# Patient Record
Sex: Female | Born: 1945 | Race: White | Hispanic: No | Marital: Married | Smoking: Former smoker
Health system: Southern US, Community
[De-identification: ages and names within clinical notes are randomized; demographics above are authoritative.]

## PROBLEM LIST (undated history)

## (undated) DIAGNOSIS — R413 Other amnesia: Secondary | ICD-10-CM

## (undated) DIAGNOSIS — R768 Other specified abnormal immunological findings in serum: Secondary | ICD-10-CM

## (undated) DIAGNOSIS — I2510 Atherosclerotic heart disease of native coronary artery without angina pectoris: Secondary | ICD-10-CM

## (undated) DIAGNOSIS — E782 Mixed hyperlipidemia: Secondary | ICD-10-CM

## (undated) DIAGNOSIS — I10 Essential (primary) hypertension: Secondary | ICD-10-CM

## (undated) DIAGNOSIS — J329 Chronic sinusitis, unspecified: Secondary | ICD-10-CM

## (undated) HISTORY — DX: Atherosclerotic heart disease of native coronary artery without angina pectoris: I25.10

## (undated) HISTORY — DX: Essential (primary) hypertension: I10

## (undated) HISTORY — DX: Other specified abnormal immunological findings in serum: R76.8

## (undated) HISTORY — DX: Mixed hyperlipidemia: E78.2

## (undated) HISTORY — PX: NASAL SINUS SURGERY: SHX719

## (undated) HISTORY — DX: Chronic sinusitis, unspecified: J32.9

---
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
## 1997-02-01 HISTORY — PX: LAPAROSCOPIC CHOLECYSTECTOMY: SUR755

## 1999-02-02 HISTORY — PX: BREAST BIOPSY: SHX20

## 2000-02-02 HISTORY — PX: TOTAL ABDOMINAL HYSTERECTOMY W/ BILATERAL SALPINGOOPHORECTOMY: SHX83

## 2010-03-25 ENCOUNTER — Encounter: Payer: Self-pay | Admitting: Cardiology

## 2010-04-22 ENCOUNTER — Encounter: Payer: Self-pay | Admitting: *Deleted

## 2010-04-22 DIAGNOSIS — E7800 Pure hypercholesterolemia, unspecified: Secondary | ICD-10-CM

## 2010-04-22 DIAGNOSIS — E782 Mixed hyperlipidemia: Secondary | ICD-10-CM | POA: Insufficient documentation

## 2010-04-22 DIAGNOSIS — R079 Chest pain, unspecified: Secondary | ICD-10-CM

## 2010-04-22 DIAGNOSIS — I10 Essential (primary) hypertension: Secondary | ICD-10-CM | POA: Insufficient documentation

## 2010-04-30 ENCOUNTER — Encounter: Payer: Self-pay | Admitting: Cardiology

## 2010-05-01 ENCOUNTER — Encounter: Payer: Self-pay | Admitting: Cardiology

## 2010-05-01 ENCOUNTER — Ambulatory Visit (INDEPENDENT_AMBULATORY_CARE_PROVIDER_SITE_OTHER): Payer: BC Managed Care – PPO | Admitting: Cardiology

## 2010-05-01 ENCOUNTER — Encounter: Payer: Self-pay | Admitting: *Deleted

## 2010-05-01 ENCOUNTER — Other Ambulatory Visit: Payer: Self-pay | Admitting: Cardiology

## 2010-05-01 VITALS — BP 158/81 | HR 61 | Ht 64.0 in | Wt 167.0 lb

## 2010-05-01 DIAGNOSIS — R079 Chest pain, unspecified: Secondary | ICD-10-CM

## 2010-05-01 DIAGNOSIS — Z79899 Other long term (current) drug therapy: Secondary | ICD-10-CM

## 2010-05-01 DIAGNOSIS — I200 Unstable angina: Secondary | ICD-10-CM

## 2010-05-01 DIAGNOSIS — E782 Mixed hyperlipidemia: Secondary | ICD-10-CM

## 2010-05-01 DIAGNOSIS — R0602 Shortness of breath: Secondary | ICD-10-CM

## 2010-05-01 DIAGNOSIS — I10 Essential (primary) hypertension: Secondary | ICD-10-CM

## 2010-05-01 NOTE — Assessment & Plan Note (Signed)
Reportedly long term, on Crestor. This has been followed by Dr. Margo Common over time.

## 2010-05-01 NOTE — Patient Instructions (Signed)
Your physician has requested that you have a cardiac catheterization. Cardiac catheterization is used to diagnose and/or treat various heart conditions. Doctors may recommend this procedure for a number of different reasons. The most common reason is to evaluate chest pain. Chest pain can be a symptom of coronary artery disease (CAD), and cardiac catheterization can show whether plaque is narrowing or blocking your heart's arteries. This procedure is also used to evaluate the valves, as well as measure the blood flow and oxygen levels in different parts of your heart. For further information please visit [email or web address] Please follow instruction sheet, as given. Your physician recommends that you go to the [facility] for lab work and a chest x-ray. PLEASE DO TODAY, IF POSSIBLE.

## 2010-05-01 NOTE — Assessment & Plan Note (Signed)
Blood pressure elevated today. Toprol-XL recently added by Dr. Margo Common. She is on labetalol as well, and likely this can be simplified going forward. An ACE inhibitor can be considered after angiography is completed.

## 2010-05-01 NOTE — Progress Notes (Signed)
Clinical Summary Kirsten Martinez is a 65 y.o.female referred for cardiology consultation by Dr. Margo Common. She is here with her husband. She reports a three-month history of sporadic "sharp" central chest discomfort, typically associated with "tingling" in her left arm, and also an ache on the left side of her jaw. This can occur with exercise and also at rest, usually moderate in intensity, and typically resolves after 5 minutes. Symptoms have been increasing in general. She states that she has not been as active, not walking regularly since injuring her right foot late last year. Prior to that she was walking 2 miles at a time without symptoms. She states she has not undergone any prior cardiac testing.  History is reviewed including long-standing hyperlipidemia, reported TIA approximately 3 years ago, and also family history of premature cardiovascular disease. She had a brother that died in his 82s suddenly with an MI.  Faxed copy of ECG from 20 to February shows sinus rhythm, nonspecific T wave changes.   Allergies  Allergen Reactions  . Azithromycin Other (See Comments)    Usually doesn't help  . Codeine Nausea Only    Current outpatient prescriptions:aspirin 81 MG tablet, Take 81 mg by mouth daily.  , Disp: , Rfl: ;  Calcium Carbonate-Vitamin D (CALTRATE 600+D) 600-400 MG-UNIT per tablet, Take 1 tablet by mouth daily.  , Disp: , Rfl: ;  cetirizine (ZYRTEC) 10 MG tablet, Take 10 mg by mouth daily.  , Disp: , Rfl: ;  labetalol (NORMODYNE) 100 MG tablet, Take 100 mg by mouth 2 (two) times daily.  , Disp: , Rfl:  metoprolol succinate (TOPROL-XL) 25 MG 24 hr tablet, Take 25 mg by mouth daily.  , Disp: , Rfl: ;  Multiple Vitamin (MULTIVITAMIN) tablet, Take 1 tablet by mouth daily.  , Disp: , Rfl: ;  nitroGLYCERIN (NITROSTAT) 0.4 MG SL tablet, Place 0.4 mg under the tongue every 5 (five) minutes as needed.  , Disp: , Rfl: ;  omeprazole (PRILOSEC OTC) 20 MG tablet, Take 20 mg by mouth daily.  , Disp: ,  Rfl:  rosuvastatin (CRESTOR) 10 MG tablet, Take 10 mg by mouth daily.  , Disp: , Rfl: ;  traZODone (DESYREL) 50 MG tablet, Take 50 mg by mouth at bedtime.  , Disp: , Rfl:    Past Medical History  Diagnosis Date  . Mixed hyperlipidemia   . Recurrent sinusitis     Suspected underlying allergic rhinitis  . Essential hypertension, benign   . Helicobacter pylori ab+     Negative EGD    Past Surgical History  Procedure Date  . Laparoscopic cholecystectomy 1999  . Breast biopsy 2001    Left  . Total abdominal hysterectomy w/ bilateral salpingoophorectomy 2002  . Nasal sinus surgery     Family History  Problem Relation Age of Onset  . Heart attack Brother     Died at age 39    Social History Ms. Romm reports that she quit smoking about 37 years ago. Her smoking use included Cigarettes. She quit after 6 years of use. She has never used smokeless tobacco. Ms. Steptoe reports that she does not drink alcohol.  Review of Systems No fevers, chills. Has gained weight since retiring. No progressive shortness of breath, cough, hemoptysis, or wheezing. No palpitations, dizziness, or syncope. No dysphasia or odynophagia. Stable appetite with no abdominal pain, melena, or hematochezia. No orthopnea, PND, or lower extremity edema. No focal motor weakness, memory problems, or speech deficits. Otherwise systems reviewed and negative except  as already outlined.   Physical Examination Filed Vitals:   05/01/10 0838  BP: 158/81  Pulse: 61  Overweight woman in no acute distress. HEENT: Conjunctiva and lids normal, oropharynx clear. Neck: Supple, no elevated JVP or carotid bruits, no thyromegaly. Lungs: Clear to auscultation, nonlabored. Cardiac: Regular rate and rhythm, no S3 or pericardial rub. Abdomen: Soft, nontender, bowel sounds present. Extremities: No pitting edema, distal pulses full. Musculoskeletal: No kyphosis. Skin: Warm and dry. Neuropsychiatric: Alert and oriented x3,  affect appropriate.   Problem List and Plan

## 2010-05-01 NOTE — Assessment & Plan Note (Signed)
Symptoms suggestive of angina, with typical and atypical features. Progressive over the last 3 months. Patient has not been as physically active during this time as well. Recent ECG reviewed and overall nonspecific. In light of her risk factor profile including family history of premature cardiovascular disease, hypertension, long-standing hyperlipidemia, and reported TIA in the past, we discussed proceeding on to further ischemic evaluation. After reviewing the risks and benefits of both noninvasive and invasive techniques, plan is to proceed with a diagnostic cardiac catheterization. This is being scheduled for next week.

## 2010-05-07 ENCOUNTER — Inpatient Hospital Stay (HOSPITAL_BASED_OUTPATIENT_CLINIC_OR_DEPARTMENT_OTHER)
Admission: RE | Admit: 2010-05-07 | Discharge: 2010-05-07 | Disposition: A | Discharge status: Home / Self Care | Payer: BC Managed Care – PPO | Source: Ambulatory Visit | Attending: Cardiovascular Disease | Admitting: Cardiovascular Disease

## 2010-05-07 DIAGNOSIS — I2510 Atherosclerotic heart disease of native coronary artery without angina pectoris: Secondary | ICD-10-CM | POA: Insufficient documentation

## 2010-05-26 ENCOUNTER — Ambulatory Visit (INDEPENDENT_AMBULATORY_CARE_PROVIDER_SITE_OTHER): Payer: BC Managed Care – PPO | Admitting: Cardiology

## 2010-05-26 ENCOUNTER — Encounter: Payer: Self-pay | Admitting: Cardiology

## 2010-05-26 VITALS — BP 128/75 | HR 68 | Ht 63.0 in | Wt 166.1 lb

## 2010-05-26 DIAGNOSIS — I2510 Atherosclerotic heart disease of native coronary artery without angina pectoris: Secondary | ICD-10-CM

## 2010-05-26 DIAGNOSIS — E782 Mixed hyperlipidemia: Secondary | ICD-10-CM

## 2010-05-26 DIAGNOSIS — I10 Essential (primary) hypertension: Secondary | ICD-10-CM

## 2010-05-26 MED ORDER — LABETALOL HCL 200 MG PO TABS: 200.0000 mg | ORAL_TABLET | Freq: Two times a day (BID) | ORAL | Status: DC

## 2010-05-26 NOTE — Assessment & Plan Note (Signed)
Nonobstructive in the major epicardials with 80% ostial diagonal stenosis, to be managed medically at this point. Continue present regimen with modifications as noted, diet and exercise discussed.

## 2010-05-26 NOTE — Assessment & Plan Note (Signed)
Plan to simplify her medications, since she is on 2 beta blockers. Will stop Toprol-XL, and increase labetalol to 200 mg p.o. B.i.d. She plans to keep a more regular check on her blood pressure an outpatient as well.

## 2010-05-26 NOTE — Assessment & Plan Note (Signed)
Continue Crestor, followup with Dr. Margo Common for fasting lipid profile and liver function tests over the next 3 months.

## 2010-05-26 NOTE — Progress Notes (Signed)
Clinical Summary Kirsten Martinez is a 65 y.o.female presenting for followup. She was seen in consultation in late March, referred for cardiac catheterization with chest pain symptoms in the setting of multiple cardiac risk factors. Procedure was performed by Dr. Excell Seltzer, outlined below. She was noted to have ostial diagonal disease that was felt to be best managed medically, at least initially, with PCI options limited to PTCA if needed.  She is here with her husband for followup, doing well overall. We reviewed the results of her cardiac catheterization, also anticipated medical therapy at this time. We discussed diet and exercise.   Allergies  Allergen Reactions  . Azithromycin Other (See Comments)    Usually doesn't help  . Codeine Nausea Only    Current outpatient prescriptions:aspirin 81 MG tablet, Take 81 mg by mouth daily.  , Disp: , Rfl: ;  Calcium Carbonate-Vitamin D (CALTRATE 600+D) 600-400 MG-UNIT per tablet, Take 1 tablet by mouth daily.  , Disp: , Rfl: ;  cetirizine (ZYRTEC) 10 MG tablet, Take 10 mg by mouth daily.  , Disp: , Rfl: ;  Multiple Vitamin (MULTIVITAMIN) tablet, Take 1 tablet by mouth daily.  , Disp: , Rfl:  omeprazole (PRILOSEC OTC) 20 MG tablet, Take 20 mg by mouth daily.  , Disp: , Rfl: ;  rosuvastatin (CRESTOR) 10 MG tablet, Take 10 mg by mouth daily.  , Disp: , Rfl: ;  traZODone (DESYREL) 50 MG tablet, Take 50 mg by mouth at bedtime.  , Disp: , Rfl: ;  DISCONTD: labetalol (NORMODYNE) 100 MG tablet, Take 100 mg by mouth 2 (two) times daily.  , Disp: , Rfl:  DISCONTD: metoprolol succinate (TOPROL-XL) 25 MG 24 hr tablet, Take 25 mg by mouth daily.  , Disp: , Rfl: ;  labetalol (NORMODYNE) 200 MG tablet, Take 1 tablet (200 mg total) by mouth 2 (two) times daily., Disp: 90 tablet, Rfl: 3;  nitroGLYCERIN (NITROSTAT) 0.4 MG SL tablet, Place 0.4 mg under the tongue every 5 (five) minutes as needed.  , Disp: , Rfl:   Past Medical History  Diagnosis Date  . Mixed hyperlipidemia     . Recurrent sinusitis     Suspected underlying allergic rhinitis  . Essential hypertension, benign   . Helicobacter pylori ab+     Negative EGD  . Coronary atherosclerosis of native coronary artery     Nonobstructive except 80% diagonal    Social History Kirsten Martinez reports that she quit smoking about 37 years ago. Her smoking use included Cigarettes. She quit after 6 years of use. She has never used smokeless tobacco. Kirsten Martinez reports that she does not drink alcohol.  Review of Systems Otherwise reviewed and negative except as outlined.  Physical Examination Filed Vitals:   05/26/10 1023  BP: 128/75  Pulse: 68  Overweight woman in no acute distress. HEENT: Conjunctiva and lids normal, oropharynx clear. Neck: Supple, no elevated JVP or carotid bruits, no thyromegaly. Lungs: Clear to auscultation, nonlabored. Cardiac: Regular rate and rhythm, no S3 or pericardial rub. Abdomen: Soft, nontender, bowel sounds present. Extremities: No pitting edema, distal pulses full. Musculoskeletal: No kyphosis. Skin: Warm and dry. Neuropsychiatric: Alert and oriented x3, affect appropriate.  Studies Cardiac catheterization 05/07/2010: Coronary angiography:  The left main is angiographically normal and   divides into the LAD and left circumflex.      LAD:  The LAD is of large caliber.  It is patent all the way to the left   ventricular apex.  There is no significant obstructive  disease.  There   are some minor luminal irregularities in the mid-LAD.  The first   diagonal is small.  It has no significant obstructive disease.  The   second diagonal is of moderate caliber.  It has an 80% ostial stenosis.   The ostial stenosis does not change with intracoronary nitroglycerin.   The remaining portions of the vessel are smooth without significant   obstructive disease.      Left circumflex:  The left circumflex is patent.  There is no   obstructive disease.  It supplies two small  obtuse marginal round   branches without significant disease.      Right coronary artery:  The right coronary artery is very large.  It is   a dominant vessel with a high anterior origin.  I had to use an AL-1   catheter to reach the vessel for angiography.  The vessel has mild 20-   30% ostial stenosis.  Nonobstructive mid vessel stenosis of 25% and   otherwise has no significant stenosis.  The vessel gives off a PDA and 2   posterolateral branches as well as an acute marginal branch.  There is   no significant stenosis in the branch vessels of the RCA.  Problem List and Plan

## 2010-05-26 NOTE — Patient Instructions (Signed)
   Follow up in 3 months as scheduled.  Stop Toprol XL  Increase Labetalol to 200mg  two times a day.

## 2010-06-04 NOTE — Cardiovascular Report (Signed)
Kirsten Martinez, Kirsten Martinez           ACCOUNT NO.:  [id number]  MEDICAL RECORD NO.:  [id number]           PATIENT TYPE:  LOCATION:                                 FACILITY:  PHYSICIAN:  Veverly Fells. Excell Seltzer, MD  DATE OF BIRTH:  [birth year]  DATE OF PROCEDURE:  05/07/2010 DATE OF DISCHARGE:                           CARDIAC CATHETERIZATION   PROCEDURES: 1. Left heart catheterization. 2. Selective coronary angiography. 3. Left ventricular angiography.  SURGEON:  Veverly Fells. Excell Seltzer, MD  Hilarie Fredrickson INDICATIONS:  [name] is a 65 year old woman with chest pain.  She has angina and has both typical and atypical features. She has multiple underlying cardiac risk factors and was referred directly for cardiac cath because of high pretest probability.  Risks and indications of the procedure were reviewed with the patient. Informed consent was obtained.  The right groin was prepped, draped, and anesthetized with 1% lidocaine.  Using the modified Seldinger technique, a 4-French sheath was placed in the right femoral artery.  Standard Judkins catheters were used for coronary angiography and left ventriculography.  The patient tolerated the procedure well.  There were no immediate complications.  PROCEDURAL FINDINGS:  Aortic pressure 122/63 with a mean of 88, left ventricular pressure 127/13.  Left ventriculography:  LV function is normal.  The ejection fraction is estimated at 65%.  There is no mitral regurgitation.  Coronary angiography:  The left main is angiographically normal and divides into the LAD and left circumflex.  LAD:  The LAD is of large caliber.  It is patent all the way to the left ventricular apex.  There is no significant obstructive disease.  There are some minor luminal irregularities in the mid-LAD.  The first diagonal is small.  It has no significant obstructive disease.  The second diagonal is of moderate caliber.  It has an 80% ostial stenosis. The ostial  stenosis does not change with intracoronary nitroglycerin. The remaining portions of the vessel are smooth without significant obstructive disease.  Left circumflex:  The left circumflex is patent.  There is no obstructive disease.  It supplies two small obtuse marginal round branches without significant disease.  Right coronary artery:  The right coronary artery is very large.  It is a dominant vessel with a high anterior origin.  I had to use an AL-1 catheter to reach the vessel for angiography.  The vessel has mild 20- 30% ostial stenosis.  Nonobstructive mid vessel stenosis of 25% and otherwise has no significant stenosis.  The vessel gives off a PDA and 2 posterolateral branches as well as an acute marginal branch.  There is no significant stenosis in the branch vessels of the RCA.  FINAL ASSESSMENT: 1. Severe ostial diagonal stenosis. 2. 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 3. Normal left ventricular systolic function.  RECOMMENDATIONS:  With single branch vessel disease and preserved LV function, I would favor initial medical therapy.  The ostial nature of her diagonal stenosis would limit our PCI options to balloon angioplasty alone.  Could consider cutting balloon angioplasty if she has refractory chest pain.     Casimiro Needle  Karren Burly, MD     MDC/MEDQ  D:  05/07/2010  T:  05/08/2010  Job:  [id number]  cc:   Jonelle Sidle, MD  Electronically Signed by Tonny Bollman MD on 06/04/2010 10:36:23 AM

## 2010-08-25 ENCOUNTER — Ambulatory Visit: Payer: BC Managed Care – PPO | Admitting: Cardiology

## 2011-06-27 ENCOUNTER — Other Ambulatory Visit: Payer: Self-pay | Admitting: Cardiology

## 2011-09-04 ENCOUNTER — Other Ambulatory Visit: Payer: Self-pay | Admitting: Cardiology

## 2011-12-03 ENCOUNTER — Other Ambulatory Visit: Payer: Self-pay | Admitting: Cardiology

## 2012-01-04 ENCOUNTER — Other Ambulatory Visit: Payer: Self-pay | Admitting: Cardiology

## 2012-02-11 ENCOUNTER — Ambulatory Visit (INDEPENDENT_AMBULATORY_CARE_PROVIDER_SITE_OTHER): Payer: Medicare PPO | Admitting: Cardiology

## 2012-02-11 ENCOUNTER — Encounter: Payer: Self-pay | Admitting: Cardiology

## 2012-02-11 VITALS — BP 122/69 | HR 64 | Ht 63.5 in | Wt 160.8 lb

## 2012-02-11 DIAGNOSIS — I10 Essential (primary) hypertension: Secondary | ICD-10-CM

## 2012-02-11 DIAGNOSIS — I2510 Atherosclerotic heart disease of native coronary artery without angina pectoris: Secondary | ICD-10-CM

## 2012-02-11 DIAGNOSIS — E782 Mixed hyperlipidemia: Secondary | ICD-10-CM

## 2012-02-11 NOTE — Patient Instructions (Addendum)

## 2012-02-11 NOTE — Progress Notes (Signed)
Clinical Summary Ms. Buechner is a 67 y.o.female presenting for followup. She was last seen in April 2012. She is here with her husband today. States in general she has done well, no definite angina symptoms, although she does have an intermittent tingling in her left forearm area with activity. Episode occurred a year ago with increased psychosocial stressors as well. She has not required any increasing nitroglycerin use, otherwise compliant with medical therapy.  She reports good lipid control on Crestor, followed by Dr. Margo Common. ECG today is normal.  We reviewed her prior cardiac history.   Allergies  Allergen Reactions  . Azithromycin Other (See Comments)    Usually doesn't help  . Codeine Nausea Only    Current Outpatient Prescriptions  Medication Sig Dispense Refill  . aspirin 81 MG tablet Take 81 mg by mouth daily.        . Calcium Carbonate-Vitamin D (CALTRATE 600+D) 600-400 MG-UNIT per tablet Take 1 tablet by mouth daily.        Marland Kitchen labetalol (NORMODYNE) 200 MG tablet TAKE 1 TABLET TWICE A DAY  60 tablet  1  . Multiple Vitamin (MULTIVITAMIN) tablet Take 1 tablet by mouth daily.        . nitroGLYCERIN (NITROSTAT) 0.4 MG SL tablet Place 0.4 mg under the tongue every 5 (five) minutes as needed.        Marland Kitchen omeprazole (PRILOSEC OTC) 20 MG tablet Take 20 mg by mouth daily.        . rosuvastatin (CRESTOR) 10 MG tablet Take 10 mg by mouth daily.        . traZODone (DESYREL) 50 MG tablet Take 50 mg by mouth at bedtime.          Past Medical History  Diagnosis Date  . Mixed hyperlipidemia   . Recurrent sinusitis     Suspected underlying allergic rhinitis  . Essential hypertension, benign   . Helicobacter pylori ab+     Negative EGD  . Coronary atherosclerosis of native coronary artery     Nonobstructive except 80% diagonal    Social History Ms. Meller reports that she quit smoking about 39 years ago. Her smoking use included Cigarettes. She quit after 6 years of use. She  has never used smokeless tobacco. Ms. Ambrosius reports that she does not drink alcohol.  Review of Systems  No palpitations or syncope. No unusual bleeding problems. Stable appetite. Otherwise negative   Physical Examination Filed Vitals:   02/11/12 1311  BP: 122/69  Pulse: 64   Filed Weights   02/11/12 1311  Weight: 160 lb 12.8 oz (72.938 kg)    No acute distress.  HEENT: Conjunctiva and lids normal, oropharynx clear.  Neck: Supple, no elevated JVP or carotid bruits, no thyromegaly.  Lungs: Clear to auscultation, nonlabored.  Cardiac: Regular rate and rhythm, no S3 or pericardial rub.  Abdomen: Soft, nontender, bowel sounds present.  Extremities: No pitting edema, distal pulses full.  Musculoskeletal: No kyphosis.  Skin: Warm and dry.  Neuropsychiatric: Alert and oriented x3, affect appropriate.   Problem List and Plan   Coronary atherosclerosis of native coronary artery Branch vessel disease as described above. Reports no advancing, clear-cut angina symptoms and ECG today is normal. Continue plan for observation and medical therapy. Annual followup arranged.  Essential hypertension, benign Blood pressure control looks good today.  Mixed hyperlipidemia Followed by Dr. Margo Common, on statin therapy. Goal LDL should be close to 70 if possible.    Jonelle Sidle, M.D., F.A.C.C.

## 2012-02-11 NOTE — Assessment & Plan Note (Signed)
Branch vessel disease as described above. Reports no advancing, clear-cut angina symptoms and ECG today is normal. Continue plan for observation and medical therapy. Annual followup arranged.

## 2012-02-11 NOTE — Assessment & Plan Note (Signed)
Blood pressure control looks good today. 

## 2012-02-11 NOTE — Assessment & Plan Note (Signed)
Followed by Dr. Margo Common, on statin therapy. Goal LDL should be close to 70 if possible.

## 2013-05-10 ENCOUNTER — Encounter: Payer: Self-pay | Admitting: Cardiology

## 2013-05-10 ENCOUNTER — Ambulatory Visit (INDEPENDENT_AMBULATORY_CARE_PROVIDER_SITE_OTHER): Payer: Medicare PPO | Admitting: Cardiology

## 2013-05-10 VITALS — BP 137/69 | HR 68 | Ht 63.5 in | Wt 157.8 lb

## 2013-05-10 DIAGNOSIS — I2510 Atherosclerotic heart disease of native coronary artery without angina pectoris: Secondary | ICD-10-CM

## 2013-05-10 DIAGNOSIS — E782 Mixed hyperlipidemia: Secondary | ICD-10-CM

## 2013-05-10 DIAGNOSIS — I10 Essential (primary) hypertension: Secondary | ICD-10-CM

## 2013-05-10 MED ORDER — NITROGLYCERIN 0.4 MG SL SUBL: 0.4000 mg | SUBLINGUAL_TABLET | SUBLINGUAL | Status: AC | PRN

## 2013-05-10 NOTE — Progress Notes (Signed)
    Clinical Summary Kirsten Martinez is a 68 y.o.female last seen in January 2014. She is here with her husband. Reports doing well without angina symptoms, has had no use of nitroglycerin. She has just got back to walking with her husband for exercise. Reports occasionally a sharp mid posterior thoracic discomfort is nonexertional. Otherwise no other symptoms. ECG today shows sinus rhythm with low voltage.  Lab work from October 2014 showed TSH 2.3, cholesterol 160, HDL 59, LDL 84, BUN 13, creatinine 0.6, potassium 4.1, normal LFTs.  She reports compliance with her medications.  Allergies  Allergen Reactions  . Azithromycin Other (See Comments)    Usually doesn't help  . Codeine Nausea Only    Current Outpatient Prescriptions  Medication Sig Dispense Refill  . aspirin 81 MG tablet Take 81 mg by mouth daily.        . Calcium Carbonate-Vitamin D (CALTRATE 600+D) 600-400 MG-UNIT per tablet Take 1 tablet by mouth daily.        Marland Kitchen. labetalol (NORMODYNE) 200 MG tablet TAKE 1 TABLET TWICE A DAY  60 tablet  1  . Multiple Vitamin (MULTIVITAMIN) tablet Take 1 tablet by mouth daily.        . nitroGLYCERIN (NITROSTAT) 0.4 MG SL tablet Place 1 tablet (0.4 mg total) under the tongue every 5 (five) minutes as needed.  25 tablet  3  . omeprazole (PRILOSEC OTC) 20 MG tablet Take 20 mg by mouth as needed.       . rosuvastatin (CRESTOR) 10 MG tablet Take 10 mg by mouth daily.        . traZODone (DESYREL) 50 MG tablet Take 50 mg by mouth at bedtime as needed.        No current facility-administered medications for this visit.    Past Medical History  Diagnosis Date  . Mixed hyperlipidemia   . Recurrent sinusitis     Suspected underlying allergic rhinitis  . Essential hypertension, benign   . Helicobacter pylori ab+     Negative EGD  . Coronary atherosclerosis of native coronary artery     Nonobstructive except 80% diagonal    Social History Ms. Toste reports that she quit smoking about 40  years ago. Her smoking use included Cigarettes. She smoked 0.00 packs per day for 6 years. She has never used smokeless tobacco. Kirsten Martinez reports that she does not drink alcohol.  Review of Systems No palpitations, dizziness, orthopnea, PND, syncope.  Physical Examination Filed Vitals:   05/10/13 0952  BP: 137/69  Pulse: 68   Filed Weights   05/10/13 0952  Weight: 157 lb 12.8 oz (71.578 kg)    No acute distress.  HEENT: Conjunctiva and lids normal, oropharynx clear.  Neck: Supple, no elevated JVP or carotid bruits, no thyromegaly.  Lungs: Clear to auscultation, nonlabored.  Cardiac: Regular rate and rhythm, no S3 or pericardial rub.  Abdomen: Soft, nontender, bowel sounds present.  Extremities: No pitting edema, distal pulses full.  Musculoskeletal: No kyphosis.  Skin: Warm and dry.  Neuropsychiatric: Alert and oriented x3, affect appropriate.   Problem List and Plan   Coronary atherosclerosis of native coronary artery Largely nonobstructive with branch vessel disease in the diagonal system, symptomatically stable on medical therapy. Continue present regimen, encouraged regular exercise. Continue annual followup, sooner if symptoms develop.  Essential hypertension, benign No change to present regimen.  Mixed hyperlipidemia LDL 84 as of October 2014, continues on Crestor.    Jonelle SidleSamuel G. Pallie Swigert, M.D., F.A.C.C.

## 2013-05-10 NOTE — Assessment & Plan Note (Signed)
Largely nonobstructive with branch vessel disease in the diagonal system, symptomatically stable on medical therapy. Continue present regimen, encouraged regular exercise. Continue annual followup, sooner if symptoms develop.

## 2013-05-10 NOTE — Assessment & Plan Note (Signed)
LDL 84 as of October 2014, continues on Crestor.

## 2013-05-10 NOTE — Patient Instructions (Signed)

## 2013-05-10 NOTE — Assessment & Plan Note (Signed)
No change to present regimen.

## 2013-12-17 NOTE — Progress Notes (Signed)
HPI: [name] is a 68 y/o patient of Dr. Diona Martinez we are seeing for ongoing assessment and management of non-obstructive CAD, Hypertension. She was last seen by Dr. Diona Martinez in April of 2015 and is here for 6 month follow up. No changes were made on last office visit.     She comes today with three to four months of increasing fatigue, mild DOE while climbing stairs, and one episode of chest pain while first awakening in the morning, pressure,radiating to her jaw, mild nausea. She took one NTG and pain subsided. She has also been having mild dizziness. Thinking it was middle ear, she went to see PCP who examined her ears and found them to be normal.   She had cardiac cath in 2012 which demonstrated severe ostial diagonal stenosis, RCA large dominent with 20-30% stenosis, mid vessel 25%. Left Cx was without disease.    She has not had any further chest or jaw pain since episode, but has been feeling tired.    Allergies  Allergen Reactions  . Azithromycin Other (See Comments)    Usually doesn't help  . Codeine Nausea Only    Current Outpatient Prescriptions  Medication Sig Dispense Refill  . aspirin 81 MG tablet Take 81 mg by mouth daily.      . Calcium Carbonate-Vitamin D (CALTRATE 600+D) 600-400 MG-UNIT per tablet Take 1 tablet by mouth daily.      Marland Kitchen. labetalol (NORMODYNE) 200 MG tablet TAKE 1 TABLET TWICE A DAY 60 tablet 1  . Multiple Vitamin (MULTIVITAMIN) tablet Take 1 tablet by mouth daily.      . nitroGLYCERIN (NITROSTAT) 0.4 MG SL tablet Place 1 tablet (0.4 mg total) under the tongue every 5 (five) minutes as needed. 25 tablet 3  . omeprazole (PRILOSEC OTC) 20 MG tablet Take 20 mg by mouth as needed.     . rosuvastatin (CRESTOR) 10 MG tablet Take 10 mg by mouth daily.      . traZODone (DESYREL) 50 MG tablet Take 50 mg by mouth at bedtime as needed.      No current facility-administered medications for this visit.    Past Medical History  Diagnosis Date  . Mixed  hyperlipidemia   . Recurrent sinusitis     Suspected underlying allergic rhinitis  . Essential hypertension, benign   . Helicobacter pylori ab+     Negative EGD  . Coronary atherosclerosis of native coronary artery     Nonobstructive except 80% diagonal    Past Surgical History  Procedure Laterality Date  . Laparoscopic cholecystectomy  1999  . Breast biopsy  2001    Left  . Total abdominal hysterectomy w/ bilateral salpingoophorectomy  2002  . Nasal sinus surgery      ROS: Review of systems complete and found to be negative unless listed above  PHYSICAL EXAM BP 138/84 mmHg  Pulse 69  Ht 5\' 3"  (1.6 m)  Wt 146 lb (66.225 kg)  BMI 25.87 kg/m2 General: Well developed, well nourished, in no acute distress Head: Eyes PERRLA, No xanthomas.   Normal cephalic and atramatic  Lungs: Clear bilaterally to auscultation and percussion. Heart: HRRR S1 S2, without MRG.  Pulses are 2+ & equal.            No carotid bruit. No JVD.  No abdominal bruits. No femoral bruits. Abdomen: Bowel sounds are positive, abdomen soft and non-tender without masses or  Hernia's noted. Msk:  Back normal, normal gait. Normal strength and tone for age. Extremities: No clubbing, cyanosis or edema.  DP +1 Neuro: Alert and oriented X 3. Psych:  Good affect, responds appropriately   EKG: NSR rate of 64 bpm.  ASSESSMENT AND PLAN

## 2013-12-18 ENCOUNTER — Ambulatory Visit (INDEPENDENT_AMBULATORY_CARE_PROVIDER_SITE_OTHER): Payer: Medicare PPO | Admitting: Adult Health

## 2013-12-18 ENCOUNTER — Encounter: Payer: Self-pay | Admitting: *Deleted

## 2013-12-18 ENCOUNTER — Encounter: Payer: Self-pay | Admitting: Adult Health

## 2013-12-18 VITALS — BP 138/84 | HR 69 | Ht 63.0 in | Wt 146.0 lb

## 2013-12-18 DIAGNOSIS — I25110 Atherosclerotic heart disease of native coronary artery with unstable angina pectoris: Secondary | ICD-10-CM

## 2013-12-18 DIAGNOSIS — I10 Essential (primary) hypertension: Secondary | ICD-10-CM

## 2013-12-18 DIAGNOSIS — R079 Chest pain, unspecified: Secondary | ICD-10-CM

## 2013-12-18 MED ORDER — ISOSORBIDE MONONITRATE ER 30 MG PO TB24
ORAL_TABLET | ORAL | Status: DC
Start: 2013-12-18 — End: 2017-03-08

## 2013-12-18 NOTE — Progress Notes (Deleted)
Name: Kirsten CommanderLinda B Rayburn    DOB: 1945-05-04  Age: 68 y.o.  MR#: [id number]       PCP:  Louie BostonAPPER,DAVID B, MD      Insurance: Payor: [employer] MEDICARE / Plan: [employer] MEDICARE CHOICE PPO / Product Type: *No Product type* /   CC:    Chief Complaint  Patient presents with  . Coronary Artery Disease  . Hypertension    VS Filed Vitals:   12/18/13 1511  BP: 138/84  Pulse: 69  Height: 5\' 3"  (1.6 m)  Weight: 146 lb (66.225 kg)    Weights Current Weight  12/18/13 146 lb (66.225 kg)  05/10/13 157 lb 12.8 oz (71.578 kg)  02/11/12 160 lb 12.8 oz (72.938 kg)    Blood Pressure  BP Readings from Last 3 Encounters:  12/18/13 138/84  05/10/13 137/69  02/11/12 122/69     Admit date:  (Not on file) Last encounter with RMR:  Visit date not found   Allergy Azithromycin and Codeine  Current Outpatient Prescriptions  Medication Sig Dispense Refill  . aspirin 81 MG tablet Take 81 mg by mouth daily.      . Calcium Carbonate-Vitamin D (CALTRATE 600+D) 600-400 MG-UNIT per tablet Take 1 tablet by mouth daily.      Marland Kitchen. labetalol (NORMODYNE) 200 MG tablet TAKE 1 TABLET TWICE A DAY 60 tablet 1  . Multiple Vitamin (MULTIVITAMIN) tablet Take 1 tablet by mouth daily.      . nitroGLYCERIN (NITROSTAT) 0.4 MG SL tablet Place 1 tablet (0.4 mg total) under the tongue every 5 (five) minutes as needed. 25 tablet 3  . omeprazole (PRILOSEC OTC) 20 MG tablet Take 20 mg by mouth as needed.     . rosuvastatin (CRESTOR) 10 MG tablet Take 10 mg by mouth daily.      . traZODone (DESYREL) 50 MG tablet Take 50 mg by mouth at bedtime as needed.      No current facility-administered medications for this visit.    Discontinued Meds:   There are no discontinued medications.  Patient Active Problem List   Diagnosis Date Noted  . Coronary atherosclerosis of native coronary artery 05/26/2010  . Essential hypertension, benign 04/22/2010  . Mixed hyperlipidemia 04/22/2010    LABS No results found for: NA, K, CL, CO2,  GLUCOSE, BUN, CREATININE, CALCIUM, GFRNONAA, GFRAA CMP  No results found for: NA, K, CL, CO2, GLUCOSE, BUN, CREATININE, CALCIUM, PROT, ALBUMIN, AST, ALT, ALKPHOS, BILITOT, GFRNONAA, GFRAA  No results found for: WBC, HGB, HCT, MCV  Lipid Panel  No results found for: CHOL, TRIG, HDL, CHOLHDL, VLDL, LDLCALC, LDLDIRECT  ABG No results found for: PHART, PCO2ART, PO2ART, HCO3, TCO2, ACIDBASEDEF, O2SAT   No results found for: TSH BNP (last 3 results) No results for input(s): PROBNP in the last 8760 hours. Cardiac Panel (last 3 results) No results for input(s): CKTOTAL, CKMB, TROPONINI, RELINDX in the last 72 hours.  Iron/TIBC/Ferritin/ %Sat No results found for: IRON, TIBC, FERRITIN, IRONPCTSAT   EKG Orders placed or performed in visit on 05/10/13  . EKG 12-Lead     Prior Assessment and Plan Problem List as of 12/18/2013      Cardiovascular and Mediastinum   Essential hypertension, benign   Last Assessment & Plan   05/10/2013 Office Visit Written 05/10/2013 10:10 AM by Jonelle SidleSamuel G McDowell, MD    No change to present regimen.    Coronary atherosclerosis of native coronary artery   Last Assessment & Plan   05/10/2013 Office Visit Written 05/10/2013 10:10  AM by Jonelle SidleSamuel G McDowell, MD    Largely nonobstructive with branch vessel disease in the diagonal system, symptomatically stable on medical therapy. Continue present regimen, encouraged regular exercise. Continue annual followup, sooner if symptoms develop.      Other   Mixed hyperlipidemia   Last Assessment & Plan   05/10/2013 Office Visit Written 05/10/2013 10:11 AM by Jonelle SidleSamuel G McDowell, MD    LDL 84 as of October 2014, continues on Crestor.        Imaging: No results found.

## 2013-12-18 NOTE — Patient Instructions (Signed)
Your physician recommends that you schedule a follow-up appointment in: AFTER YOUR STRESS TEST  Your physician has recommended you make the following change in your medication:   START IMDUR 15 MG DAILY  Your physician has requested that you have en exercise stress myoview. For further information please visit [email or web address]. Please follow instruction sheet, as given.   Thank you for choosing [facility]!!

## 2013-12-18 NOTE — Assessment & Plan Note (Signed)
BP is well controlled. I will have her hold BB the day of the stress test.

## 2013-12-18 NOTE — Assessment & Plan Note (Addendum)
This appears to be unstable angina, but only one episode. She has had no further symptoms with the exception of DOE and fatigue. She is medically compliant. I have discussed repeating  cardiac cath with her. She would prefer not to proceed.  I have spoken with Dr. Diona BrownerMcDowell who has recommended stress cardiolite, and low dose nitrates.   I will begin her on isosorbide mono, 15 mg daily. Plan NM stress test this week. She understands that if this is abnormal we will recommend cardiac cath. I have also advised that if she has recurrent chest or jaw pain at rest again, she will need to be seen in ER.   She verbalizes understanding.

## 2013-12-19 NOTE — Addendum Note (Signed)
Addended by: Burman NievesASHWORTH, Cote Mayabb T on: 12/19/2013 11:35 AM   Modules accepted: Orders

## 2013-12-20 ENCOUNTER — Encounter (HOSPITAL_COMMUNITY): Payer: Self-pay

## 2013-12-20 ENCOUNTER — Encounter (HOSPITAL_COMMUNITY)
Admission: RE | Admit: 2013-12-20 | Discharge: 2013-12-20 | Disposition: A | Discharge status: Home / Self Care | Payer: Medicare PPO | Source: Ambulatory Visit | Attending: Adult Health | Admitting: Adult Health

## 2013-12-20 ENCOUNTER — Ambulatory Visit (HOSPITAL_COMMUNITY)
Admission: RE | Admit: 2013-12-20 | Discharge: 2013-12-20 | Disposition: A | Discharge status: Home / Self Care | Payer: Medicare PPO | Source: Ambulatory Visit | Attending: Adult Health | Admitting: Adult Health

## 2013-12-20 DIAGNOSIS — I10 Essential (primary) hypertension: Secondary | ICD-10-CM | POA: Insufficient documentation

## 2013-12-20 DIAGNOSIS — R079 Chest pain, unspecified: Secondary | ICD-10-CM | POA: Insufficient documentation

## 2013-12-20 DIAGNOSIS — I25119 Atherosclerotic heart disease of native coronary artery with unspecified angina pectoris: Secondary | ICD-10-CM | POA: Insufficient documentation

## 2013-12-20 DIAGNOSIS — R0600 Dyspnea, unspecified: Secondary | ICD-10-CM | POA: Insufficient documentation

## 2013-12-20 MED ORDER — TECHNETIUM TC 99M SESTAMIBI - CARDIOLITE
10.0000 | Freq: Once | INTRAVENOUS | Status: AC | PRN
  Administered 2013-12-20: 10 via INTRAVENOUS

## 2013-12-20 MED ORDER — SODIUM CHLORIDE 0.9 % IJ SOLN
INTRAMUSCULAR | Status: AC
  Administered 2013-12-20: 10 mL via INTRAVENOUS
  Filled 2013-12-20: qty 10

## 2013-12-20 MED ORDER — SODIUM CHLORIDE 0.9 % IJ SOLN
10.0000 mL | INTRAMUSCULAR | Status: DC | PRN
  Administered 2013-12-20: 10 mL via INTRAVENOUS
  Filled 2013-12-20: qty 10

## 2013-12-20 MED ORDER — TECHNETIUM TC 99M SESTAMIBI GENERIC - CARDIOLITE
30.0000 | Freq: Once | INTRAVENOUS | Status: AC | PRN
  Administered 2013-12-20: 30 via INTRAVENOUS

## 2013-12-20 MED ORDER — REGADENOSON 0.4 MG/5ML IV SOLN
INTRAVENOUS | Status: AC
  Filled 2013-12-20: qty 5

## 2013-12-20 NOTE — Progress Notes (Signed)
Stress Lab Nurses Notes - [facility]  Nemiah CommanderLinda B Milroy 12/20/2013 Reason for doing test: CAD and Chest Pain Type of test: Stress Cardiolite Nurse performing test: Parke PoissonPhyllis Billingsly, RN Nuclear Medicine Tech: Lyndel Pleasureyan Liles Echo Tech: Not Applicable MD performing test: Koneswaran/K.Lyman BishopLawrence NP Family MD: Margo Commonapper Test explained and consent signed: Yes.   IV started: Saline lock flushed, No redness or edema and Saline lock started in radiology Symptoms: SOB & Fatigue Treatment/Intervention: None Reason test stopped: fatigue After recovery IV was: Discontinued via X-ray tech and No redness or edema Patient to return to Nuc. Med at : 11:35 Patient discharged: Home Patient's Condition upon discharge was: stable Comments: During test peak BP 180/101 & HR 134.  Recovery BP 128/84 & HR 85.  Symptoms resolved in recovery. Erskine SpeedBillingsley, Deon Duer T

## 2013-12-21 ENCOUNTER — Telehealth: Payer: Self-pay | Admitting: *Deleted

## 2013-12-21 DIAGNOSIS — R9439 Abnormal result of other cardiovascular function study: Secondary | ICD-10-CM

## 2013-12-21 NOTE — Telephone Encounter (Signed)
-----   Message from Jodelle GrossKathryn M Lawrence, NP sent at 12/21/2013  7:21 AM EST ----- Please order echo for this lady. Concern about EF on NM study being low. Verification needed concerning accurate assessment. Otherwise negative for ischemia.

## 2013-12-21 NOTE — Telephone Encounter (Signed)
Orders in will forward to receptionist to schedule. Pt needs Monday or early Tuesday will be out of town for thanksgiving.

## 2013-12-24 ENCOUNTER — Ambulatory Visit (HOSPITAL_COMMUNITY)
Admission: RE | Admit: 2013-12-24 | Discharge: 2013-12-24 | Disposition: A | Discharge status: Home / Self Care | Payer: Medicare PPO | Source: Ambulatory Visit | Attending: Adult Health | Admitting: Adult Health

## 2013-12-24 DIAGNOSIS — I10 Essential (primary) hypertension: Secondary | ICD-10-CM | POA: Insufficient documentation

## 2013-12-24 DIAGNOSIS — E785 Hyperlipidemia, unspecified: Secondary | ICD-10-CM | POA: Diagnosis not present

## 2013-12-24 DIAGNOSIS — R9430 Abnormal result of cardiovascular function study, unspecified: Secondary | ICD-10-CM | POA: Insufficient documentation

## 2013-12-24 DIAGNOSIS — R9439 Abnormal result of other cardiovascular function study: Secondary | ICD-10-CM

## 2013-12-24 DIAGNOSIS — I359 Nonrheumatic aortic valve disorder, unspecified: Secondary | ICD-10-CM

## 2013-12-24 NOTE — Progress Notes (Signed)
  Echocardiogram 2D Echocardiogram has been performed.  Kirsten Martinez 12/24/2013, 2:02 PM

## 2013-12-25 ENCOUNTER — Telehealth: Payer: Self-pay | Admitting: *Deleted

## 2013-12-25 NOTE — Telephone Encounter (Signed)
-----   Message from Jodelle GrossKathryn M Lawrence, NP sent at 12/25/2013  2:02 PM EST ----- Normal EF. Compared to NM study with very low EF. No changes in her medical regimen.  Follow up with Dr. Diona BrownerMcDowell

## 2013-12-25 NOTE — Telephone Encounter (Signed)
Pt made aware, forwarded results to Dr. Margo Commonapper. Joni ReiningKathryn Lawrence, NP started IMDUR 15 mg on 12/18/13. Pt states she has had a headache since. Wants to know if ok to stop taking Imdur. Will forward to Joni ReiningKathryn Lawrence, NP

## 2013-12-26 NOTE — Telephone Encounter (Signed)
Have her take Extra strength tylenol every 6 hours, 2 tablets. Her body should get used to it within two weeks. IF not will have to stop it.

## 2013-12-26 NOTE — Telephone Encounter (Signed)
Pt made aware and will update in 2 weeks if headaches do not stop.

## 2014-01-15 ENCOUNTER — Ambulatory Visit: Payer: Medicare PPO | Admitting: Adult Health

## 2014-01-15 ENCOUNTER — Ambulatory Visit (INDEPENDENT_AMBULATORY_CARE_PROVIDER_SITE_OTHER): Payer: Medicare PPO | Admitting: Cardiology

## 2014-01-15 ENCOUNTER — Encounter: Payer: Self-pay | Admitting: Cardiology

## 2014-01-15 VITALS — BP 128/82 | HR 75 | Ht 63.0 in | Wt 156.0 lb

## 2014-01-15 DIAGNOSIS — I2510 Atherosclerotic heart disease of native coronary artery without angina pectoris: Secondary | ICD-10-CM

## 2014-01-15 DIAGNOSIS — I10 Essential (primary) hypertension: Secondary | ICD-10-CM

## 2014-01-15 NOTE — Assessment & Plan Note (Signed)
Blood pressure is normal today. 

## 2014-01-15 NOTE — Assessment & Plan Note (Signed)
Stable at this point on medical therapy. Low-dose Imdur was added, she has not decided whether she will stay on this long-term as yet, depend somewhat on symptom frequency. Stress testing was overall reassuring with no active scar or ischemia, and LVEF normal by echocardiogram. Follow-up arranged in 6 months, sooner if needed.

## 2014-01-15 NOTE — Progress Notes (Signed)
Reason for visit: CAD, hypertension  Clinical Summary Ms. Kirsten Martinez is a 68 y.o.female seen recently by Ms. Lawrence NP in November. She was reporting dyspnea and exertion with intermittent chest pain at that time. Follow-up ischemic testing was arranged.  Exercise Cardiolite on November 19 showed no diagnostic ST segment changes at 7 METs, no evidence of scar or ischemia by perfusion imaging. LVEF was calculated at 32%, however echocardiogram was performed revealing normal LVEF of 60-65% with grade 1 diastolic dysfunction. Overall reassuring results. We reviewed this testing today.  She is here with her husband for follow-up. Reports no further symptoms. She did have headache with low-dose Imdur but has been tolerating it recently. She is contemplating coming off the medicine to see if her symptoms come back prior to deciding that this will be a long-term therapy for her. Our plan is to continue medical therapy and observation unless symptoms become more frequent or intense despite medical therapy.   Allergies  Allergen Reactions  . Azithromycin Other (See Comments)    Usually doesn't help  . Codeine Nausea Only    Current Outpatient Prescriptions  Medication Sig Dispense Refill  . aspirin 81 MG tablet Take 81 mg by mouth daily.      . Calcium Carbonate-Vitamin D (CALTRATE 600+D) 600-400 MG-UNIT per tablet Take 1 tablet by mouth daily.      . isosorbide mononitrate (IMDUR) 30 MG 24 hr tablet TALK 15 MG DAILY 30 tablet 3  . labetalol (NORMODYNE) 200 MG tablet TAKE 1 TABLET TWICE A DAY 60 tablet 1  . Multiple Vitamin (MULTIVITAMIN) tablet Take 1 tablet by mouth daily.      . nitroGLYCERIN (NITROSTAT) 0.4 MG SL tablet Place 1 tablet (0.4 mg total) under the tongue every 5 (five) minutes as needed. 25 tablet 3  . omeprazole (PRILOSEC OTC) 20 MG tablet Take 20 mg by mouth as needed.     . rosuvastatin (CRESTOR) 10 MG tablet Take 10 mg by mouth daily.      . traZODone (DESYREL) 50 MG  tablet Take 50 mg by mouth at bedtime as needed.      No current facility-administered medications for this visit.    Past Medical History  Diagnosis Date  . Mixed hyperlipidemia   . Recurrent sinusitis     Suspected underlying allergic rhinitis  . Essential hypertension, benign   . Helicobacter pylori ab+     Negative EGD  . Coronary atherosclerosis of native coronary artery     Nonobstructive except 80% diagonal    Social History Ms. Matura reports that she quit smoking about 41 years ago. Her smoking use included Cigarettes. She started smoking about 55 years ago. She has a 2.4 pack-year smoking history. She has never used smokeless tobacco. Ms. Kirsten Martinez reports that she does not drink alcohol.  Review of Systems Complete review of systems negative except as otherwise outlined in the clinical summary and also the following. Reports intermittent feelings of vertigo, especially when she is up in bed, turns a certain way. Could be BPV. No palpitations.  Physical Examination Filed Vitals:   01/15/14 0902  BP: 128/82  Pulse: 75   Filed Weights   01/15/14 0902  Weight: 156 lb (70.761 kg)    No acute distress.  HEENT: Conjunctiva and lids normal, oropharynx clear.  Neck: Supple, no elevated JVP or carotid bruits, no thyromegaly.  Lungs: Clear to auscultation, nonlabored.  Cardiac: Regular rate and rhythm, no S3 or pericardial rub.  Abdomen: Soft, nontender, bowel  sounds present.  Extremities: No pitting edema, distal pulses full.  Musculoskeletal: No kyphosis.  Skin: Warm and dry.  Neuropsychiatric: Alert and oriented x3, affect appropriate.   Problem List and Plan   Coronary atherosclerosis of native coronary artery Stable at this point on medical therapy. Low-dose Imdur was added, she has not decided whether she will stay on this long-term as yet, depend somewhat on symptom frequency. Stress testing was overall reassuring with no active scar or  ischemia, and LVEF normal by echocardiogram. Follow-up arranged in 6 months, sooner if needed.  Essential hypertension, benign Blood pressure is normal today.    Jonelle SidleSamuel G. McDowell, M.D., F.A.C.C.

## 2014-01-15 NOTE — Patient Instructions (Signed)
Your physician wants you to follow-up in: 6 months You will receive a reminder letter in the mail two months in advance. If you don't receive a letter, please call our office to schedule the follow-up appointment.     Your physician recommends that you continue on your current medications as directed. Please refer to the Current Medication list given to you today.      Thank you for choosing [facility] !        

## 2014-01-18 ENCOUNTER — Ambulatory Visit: Payer: Medicare PPO | Admitting: Adult Health

## 2016-02-20 ENCOUNTER — Ambulatory Visit (INDEPENDENT_AMBULATORY_CARE_PROVIDER_SITE_OTHER): Payer: Medicare Other | Admitting: Urology

## 2016-02-20 DIAGNOSIS — N952 Postmenopausal atrophic vaginitis: Secondary | ICD-10-CM

## 2016-02-20 DIAGNOSIS — R310 Gross hematuria: Secondary | ICD-10-CM | POA: Diagnosis not present

## 2016-02-25 ENCOUNTER — Other Ambulatory Visit: Payer: Self-pay | Admitting: Urology

## 2016-02-25 DIAGNOSIS — R310 Gross hematuria: Secondary | ICD-10-CM

## 2016-03-08 ENCOUNTER — Ambulatory Visit (HOSPITAL_COMMUNITY)
Admission: RE | Admit: 2016-03-08 | Discharge: 2016-03-08 | Disposition: A | Discharge status: Home / Self Care | Payer: Medicare Other | Source: Ambulatory Visit | Attending: Urology | Admitting: Urology

## 2016-03-08 ENCOUNTER — Encounter (HOSPITAL_COMMUNITY): Payer: Self-pay

## 2016-03-16 ENCOUNTER — Ambulatory Visit (HOSPITAL_COMMUNITY)
Admission: RE | Admit: 2016-03-16 | Discharge: 2016-03-16 | Disposition: A | Discharge status: Home / Self Care | Payer: Medicare Other | Source: Ambulatory Visit | Attending: Urology | Admitting: Urology

## 2016-03-16 DIAGNOSIS — M5136 Other intervertebral disc degeneration, lumbar region: Secondary | ICD-10-CM | POA: Diagnosis not present

## 2016-03-16 DIAGNOSIS — R310 Gross hematuria: Secondary | ICD-10-CM | POA: Diagnosis present

## 2016-03-16 DIAGNOSIS — M4186 Other forms of scoliosis, lumbar region: Secondary | ICD-10-CM | POA: Insufficient documentation

## 2016-03-16 DIAGNOSIS — I2510 Atherosclerotic heart disease of native coronary artery without angina pectoris: Secondary | ICD-10-CM | POA: Diagnosis not present

## 2016-03-16 LAB — POCT I-STAT CREATININE: CREATININE: 0.6 mg/dL (ref 0.44–1.00)

## 2016-03-16 MED ORDER — IOPAMIDOL (ISOVUE-300) INJECTION 61%
125.0000 mL | Freq: Once | INTRAVENOUS | Status: AC | PRN
  Administered 2016-03-16: 125 mL via INTRAVENOUS

## 2016-04-02 ENCOUNTER — Other Ambulatory Visit (HOSPITAL_COMMUNITY)
Admission: AD | Admit: 2016-04-02 | Discharge: 2016-04-02 | Disposition: A | Discharge status: Home / Self Care | Payer: Medicare Other | Source: Other Acute Inpatient Hospital | Attending: Urology | Admitting: Urology

## 2016-04-02 ENCOUNTER — Ambulatory Visit (INDEPENDENT_AMBULATORY_CARE_PROVIDER_SITE_OTHER): Payer: Medicare Other | Admitting: Urology

## 2016-04-02 DIAGNOSIS — R310 Gross hematuria: Secondary | ICD-10-CM | POA: Insufficient documentation

## 2016-04-02 LAB — URINALYSIS, COMPLETE (UACMP) WITH MICROSCOPIC
BILIRUBIN URINE: NEGATIVE
Glucose, UA: NEGATIVE mg/dL
Hgb urine dipstick: NEGATIVE
Ketones, ur: NEGATIVE mg/dL
NITRITE: NEGATIVE
Protein, ur: NEGATIVE mg/dL
Specific Gravity, Urine: 1.018 (ref 1.005–1.030)
pH: 5 (ref 5.0–8.0)

## 2016-04-04 LAB — URINE CULTURE: Culture: <10000 — AB

## 2016-07-16 ENCOUNTER — Ambulatory Visit: Payer: Medicare Other | Admitting: Urology

## 2016-11-05 ENCOUNTER — Ambulatory Visit (INDEPENDENT_AMBULATORY_CARE_PROVIDER_SITE_OTHER): Payer: Medicare Other | Admitting: Urology

## 2016-11-05 ENCOUNTER — Other Ambulatory Visit (HOSPITAL_COMMUNITY)
Admission: RE | Admit: 2016-11-05 | Discharge: 2016-11-05 | Disposition: A | Discharge status: Home / Self Care | Payer: Medicare Other | Source: Ambulatory Visit | Attending: Urology | Admitting: Urology

## 2016-11-05 DIAGNOSIS — N33 Bladder disorders in diseases classified elsewhere: Secondary | ICD-10-CM | POA: Diagnosis not present

## 2016-11-05 DIAGNOSIS — R310 Gross hematuria: Secondary | ICD-10-CM | POA: Diagnosis not present

## 2017-03-04 NOTE — Progress Notes (Signed)
Cardiology Office Note  Date: 03/08/2017   ID: Kirsten CommanderLinda B Reta, DOB 1945/09/14, MRN [id number]  PCP: Louie Bostonapper, David B., MD  Evaluating Cardiologist: Nona DellSamuel McDowell, MD   Chief Complaint  Patient presents with  . Coronary Artery Disease    History of Present Illness: Kirsten Martinez is a 72 y.o. female not seen in the office since December 2015.  She is referred back to reestablish follow-up per PCP.  She is here today with her husband.  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  She and her husband travel, they are going on a cruise to [place] in May.  She has not been exercising, we did discuss this today.  She does not report any angina symptoms or unusual shortness of breath with typical activities.  Ischemic and structural cardiac testing from 2015 is outlined below.  I personally reviewed her ECG today which shows sinus rhythm with nonspecific T wave changes.  I reviewed her medications.  Regimen includes aspirin, labetalol, Crestor, and as needed nitroglycerin.  Past Medical History:  Diagnosis Date  . Coronary atherosclerosis of native coronary artery    Nonobstructive except 80% diagonal  . Essential hypertension, benign   . Helicobacter pylori ab+    Negative EGD  . Mixed hyperlipidemia   . Recurrent sinusitis    Suspected underlying allergic rhinitis    Past Surgical History:  Procedure Laterality Date  . BREAST BIOPSY  2001   Left  . LAPAROSCOPIC CHOLECYSTECTOMY  1999  . NASAL SINUS SURGERY    . TOTAL ABDOMINAL HYSTERECTOMY W/ BILATERAL SALPINGOOPHORECTOMY  2002    Current Outpatient Medications  Medication Sig Dispense Refill  . aspirin 81 MG tablet Take 81 mg by mouth daily.      . Calcium Carbonate-Vitamin D (CALTRATE 600+D) 600-400 MG-UNIT per tablet Take 1 tablet by mouth daily.      . cetirizine (ZYRTEC) 10 MG tablet Take 10 mg by mouth as needed for allergies.    Marland Kitchen. labetalol (NORMODYNE) 200 MG tablet TAKE 1 TABLET TWICE A DAY  60 tablet 1  . Multiple Vitamin (MULTIVITAMIN) tablet Take 1 tablet by mouth daily.      . nitroGLYCERIN (NITROSTAT) 0.4 MG SL tablet Place 1 tablet (0.4 mg total) under the tongue every 5 (five) minutes as needed. 25 tablet 3  . omeprazole (PRILOSEC OTC) 20 MG tablet Take 20 mg by mouth daily as needed.     . rosuvastatin (CRESTOR) 20 MG tablet Take 20 mg by mouth daily.    . traZODone (DESYREL) 50 MG tablet Take 100 mg by mouth at bedtime as needed.     . triamcinolone (NASACORT) 55 MCG/ACT AERO nasal inhaler Place 2 sprays into the nose as needed.     No current facility-administered medications for this visit.    Allergies:  Azithromycin and Codeine   Social History: The patient  reports that she quit smoking about 44 years ago. Her smoking use included cigarettes. She started smoking about 59 years ago. She has a 2.40 pack-year smoking history. she has never used smokeless tobacco. She reports that she does not drink alcohol or use drugs.   Family History: The patient's family history includes Heart attack in her brother.   ROS:  Please see the history of present illness. Otherwise, complete review of systems is positive for occasional reflux symptoms.  All other systems are reviewed and negative.   Physical Exam: VS:  BP 140/78   Pulse 64   Ht  5\' 3"  (1.6 m)   Wt 144 lb 9.6 oz (65.6 kg)   SpO2 96%   BMI 25.61 kg/m , BMI Body mass index is 25.61 kg/m.  Wt Readings from Last 3 Encounters:  03/08/17 144 lb 9.6 oz (65.6 kg)  01/15/14 156 lb (70.8 kg)  12/18/13 146 lb (66.2 kg)    General: Patient appears comfortable at rest. HEENT: Conjunctiva and lids normal, oropharynx clear. Neck: Supple, no elevated JVP or carotid bruits, no thyromegaly. Lungs: Clear to auscultation, nonlabored breathing at rest. Cardiac: Regular rate and rhythm, no S3 or significant systolic murmur, no pericardial rub. Abdomen: Soft, nontender, bowel sounds present. Extremities: No pitting edema, distal  pulses 2+. Skin: Warm and dry. Musculoskeletal: No kyphosis. Neuropsychiatric: Alert and oriented x3, affect grossly appropriate.  ECG: I personally reviewed the tracing from 12/18/2013 which showed normal sinus rhythm.  Recent Labwork: 03/16/2016: Creatinine, Ser 0.60   Other Studies Reviewed Today:  Lexiscan Myoview 12/20/2013: IMPRESSION: 1. No reversible ischemia or infarction.  2. Normal left ventricular wall motion.  3. Left ventricular ejection fraction 32%. However, this may be due to a gating error as systolic function appeared grossly normal.  4. Intermediate to high-risk stress test findings based on severely reduced LV systolic function. Recommend echocardiogram for a more accurate assessment of LV systolic function and regional wall motion.  Echocardiogram 12/24/2013: Study Conclusions  - Left ventricle: The cavity size was normal. Wall thickness was normal. Systolic function was normal. The estimated ejection fraction was in the range of 60% to 65%. Wall motion was normal; there were no regional wall motion abnormalities. Doppler parameters are consistent with abnormal left ventricular relaxation (grade 1 diastolic dysfunction). - Aortic valve: Mildly calcified annulus. Trileaflet; mildly thickened leaflets. There was mild regurgitation. Regurgitation pressure half-time: 682 ms. - Left atrium: The atrium was mildly dilated. - Atrial septum: No defect or patent foramen ovale was identified. - Technically adequate study.  Assessment and Plan:  1.  History of nonobstructive CAD with the exception of an 80% diagonal stenosis that has been managed medically.  She does not report any angina symptoms.  ECG reviewed and stable.  Last ischemic assessment was in 2015.  At this point would recommend continued medical therapy including aspirin and statin.  We also discussed a basic walking plan for exercise.  2.  Essential hypertension, continues on  labetalol.  Keep follow-up with PCP.  3.  Mixed hyperlipidemia, remains on Crestor.  Keep follow-up with PCP with goal LDL around 70 or less.  4.  Tobacco abuse in remission.  Remote history.  Current medicines were reviewed with the patient today.   Orders Placed This Encounter  Procedures  . EKG 12-Lead    Disposition: Follow-up in 1 year, sooner if needed.  Signed, Jonelle Sidle, MD, [facility] 03/08/2017 11:17 AM    [facility] at [facility] [address] [place], [place], [place] [address] Phone: [phone]; Fax: [phone]

## 2017-03-07 ENCOUNTER — Encounter: Payer: Self-pay | Admitting: *Deleted

## 2017-03-08 ENCOUNTER — Ambulatory Visit: Payer: Medicare Other | Admitting: Cardiology

## 2017-03-08 ENCOUNTER — Encounter: Payer: Self-pay | Admitting: Cardiology

## 2017-03-08 VITALS — BP 140/78 | HR 64 | Ht 63.0 in | Wt 144.6 lb

## 2017-03-08 DIAGNOSIS — E782 Mixed hyperlipidemia: Secondary | ICD-10-CM | POA: Diagnosis not present

## 2017-03-08 DIAGNOSIS — I2510 Atherosclerotic heart disease of native coronary artery without angina pectoris: Secondary | ICD-10-CM | POA: Diagnosis not present

## 2017-03-08 DIAGNOSIS — I10 Essential (primary) hypertension: Secondary | ICD-10-CM

## 2017-03-08 DIAGNOSIS — F17201 Nicotine dependence, unspecified, in remission: Secondary | ICD-10-CM

## 2017-03-08 NOTE — Patient Instructions (Signed)

## 2017-10-24 IMAGING — CT CT ABD-PEL WO/W CM
3 of 12 series · 12 of 46 positions shown, 18 images · IV contrast (iopamidol)
Comparison: None.

CLINICAL DATA: Recurrent urinary tract infections for 6 months with
dysuria. Gross hematuria.

EXAM:
CT ABDOMEN AND PELVIS WITHOUT AND WITH CONTRAST
TECHNIQUE: Multidetector CT imaging of the abdomen and pelvis was performed
following the standard protocol before and following the bolus
administration of intravenous contrast.
CONTRAST:  125mL 901SZ7-ADD IOPAMIDOL (901SZ7-ADD) INJECTION 61%

[Series 2: axial pre · axial · non-contrast · 0.73mm/px · z∈[-647,-332]mm · 6 of 89 slices shown, 11 images]
[im 13/89  soft-tissue]
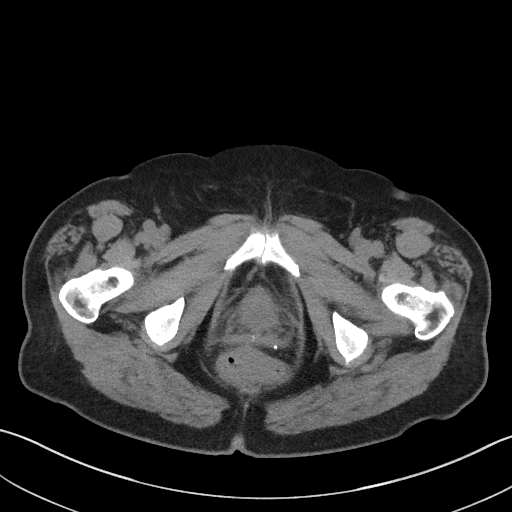
[im 13/89  bone]
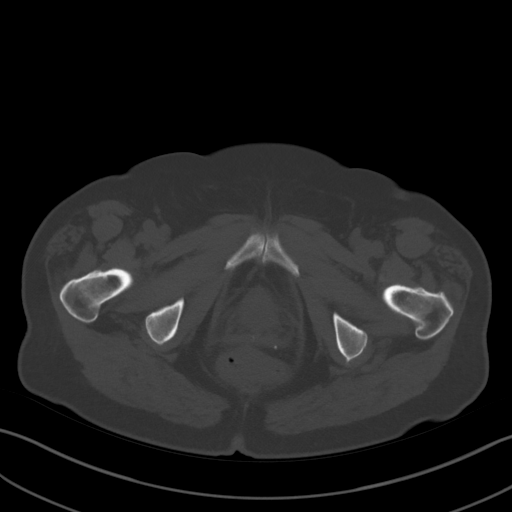
[im 26/89  soft-tissue]
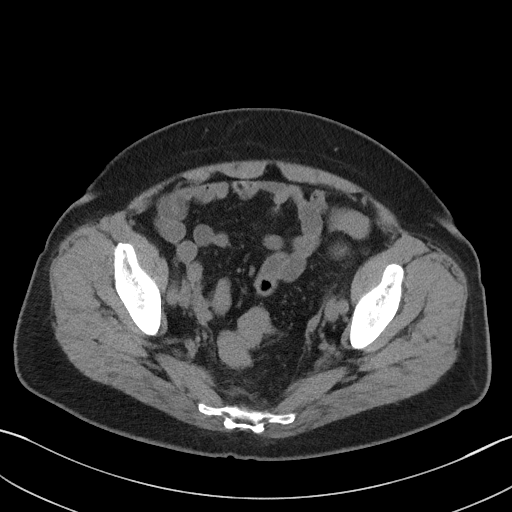
[im 38/89  soft-tissue]
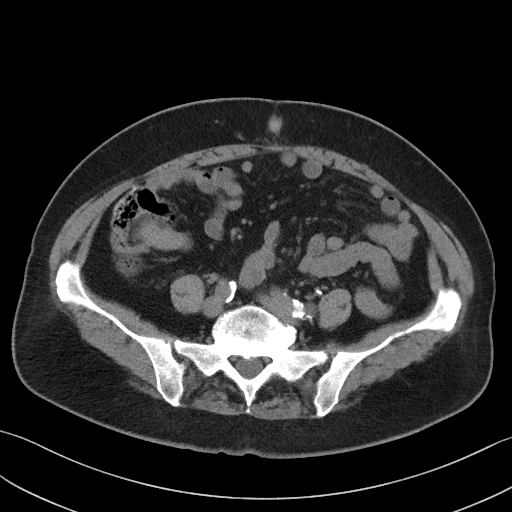
[im 38/89  lung]
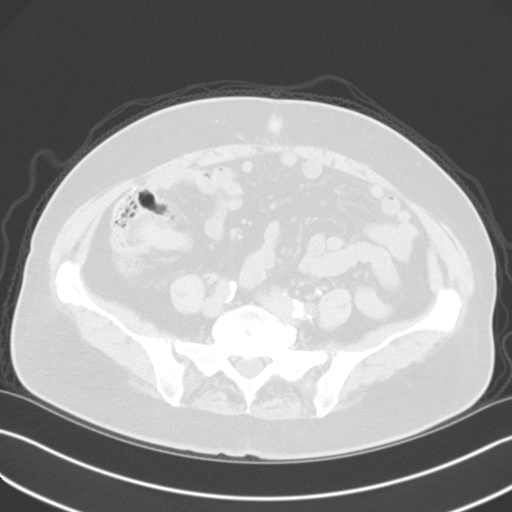
[im 51/89  soft-tissue]
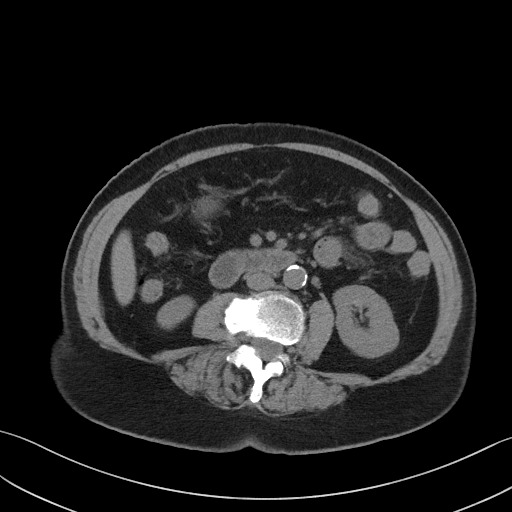
[im 51/89  lung]
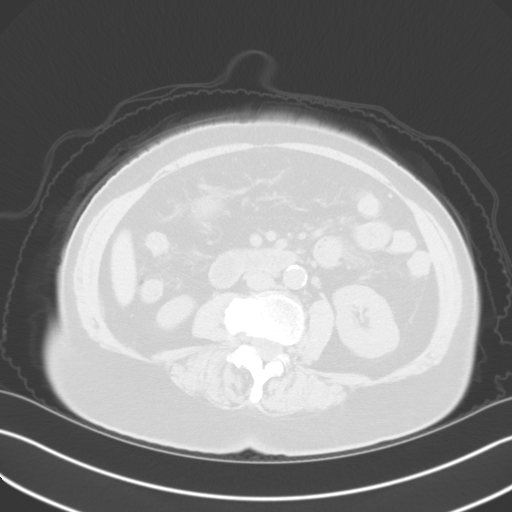
[im 63/89  soft-tissue]
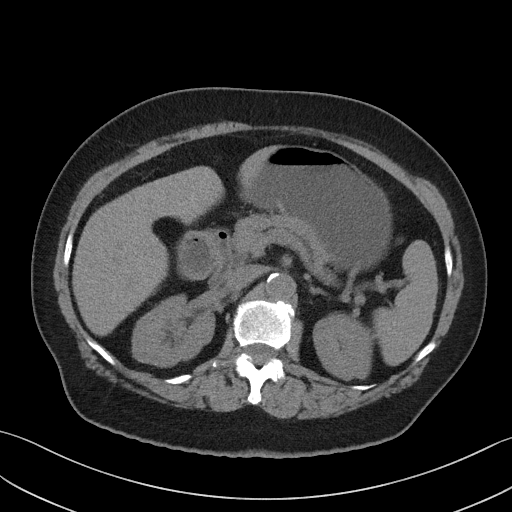
[im 63/89  lung]
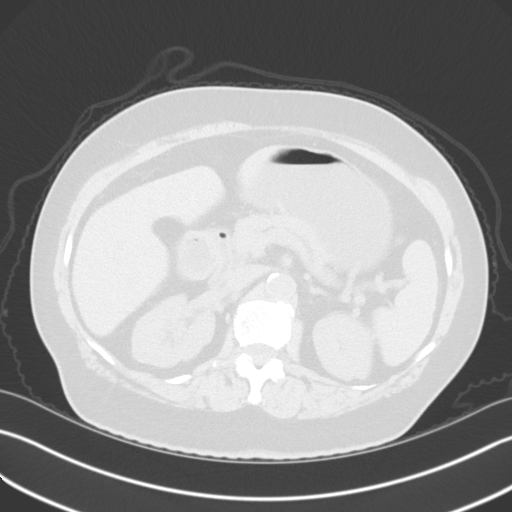
[im 76/89  soft-tissue]
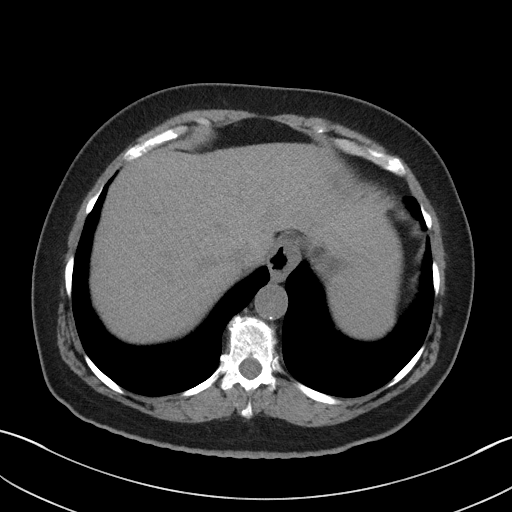
[im 76/89  lung]
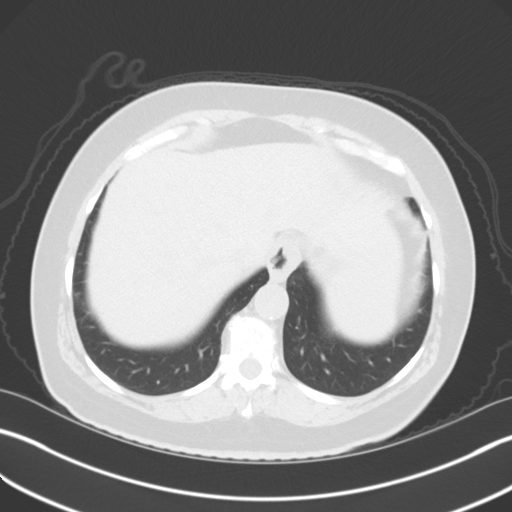

[Series 5: coronal pre · coronal · non-contrast · 0.66mm/px · 2 of 88 slices shown, 3 images]
[im 30/88  soft-tissue]
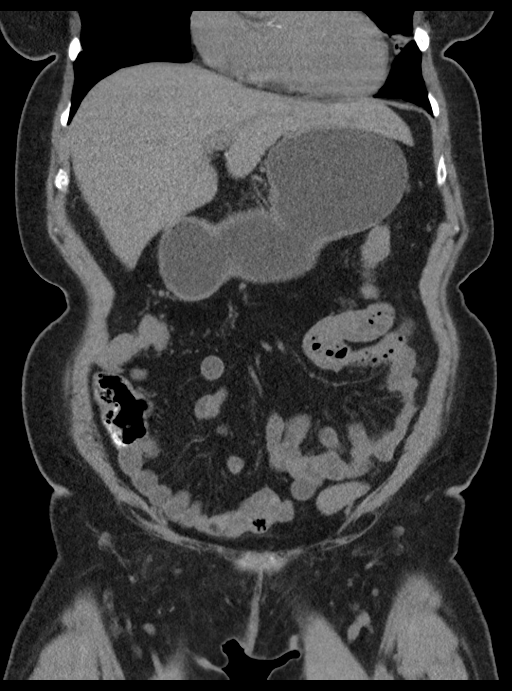
[im 30/88  bone]
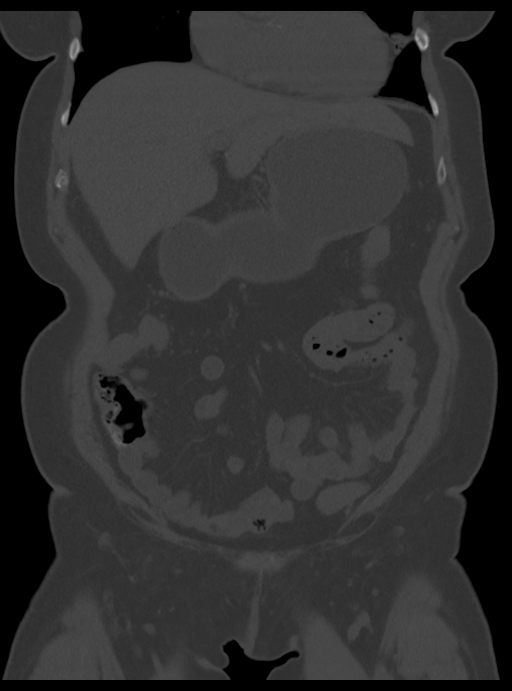
[im 59/88  soft-tissue]
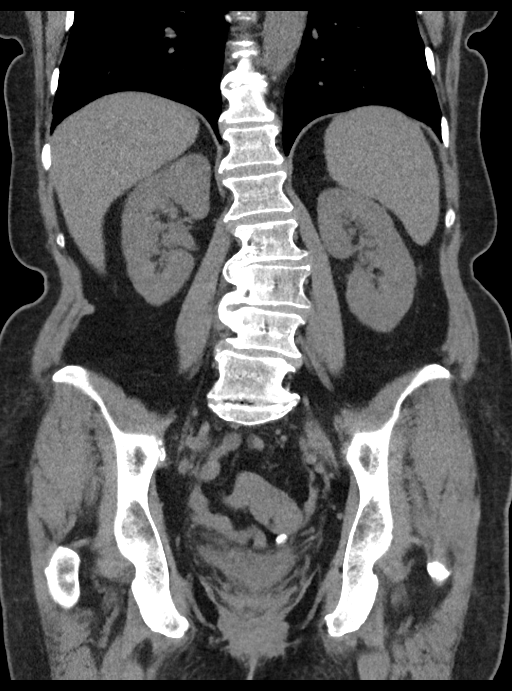

[Series 7: axial post · axial · 0.73mm/px · z∈[-647,-457]mm · 4 of 89 slices shown]
[im 13/89  soft-tissue]
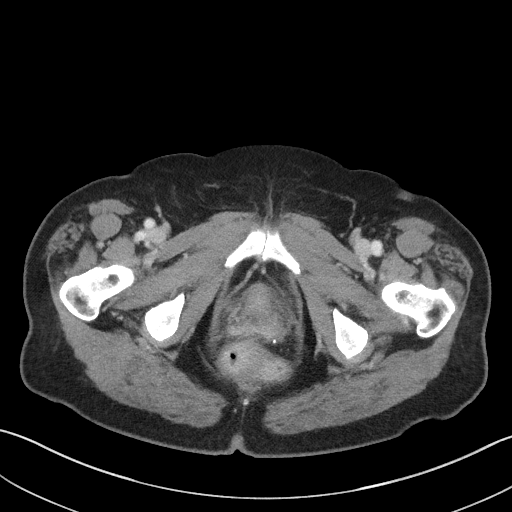
[im 26/89  soft-tissue]
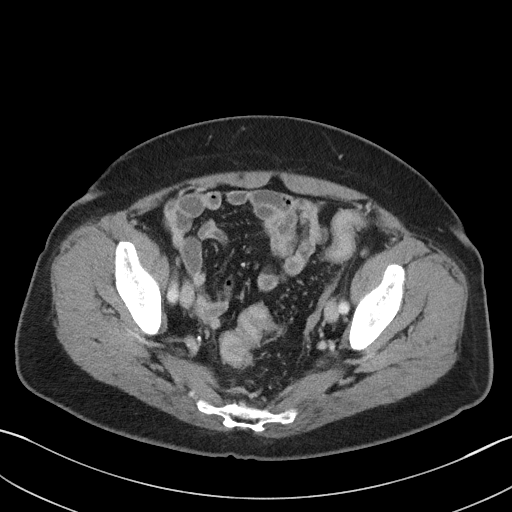
[im 38/89  soft-tissue]
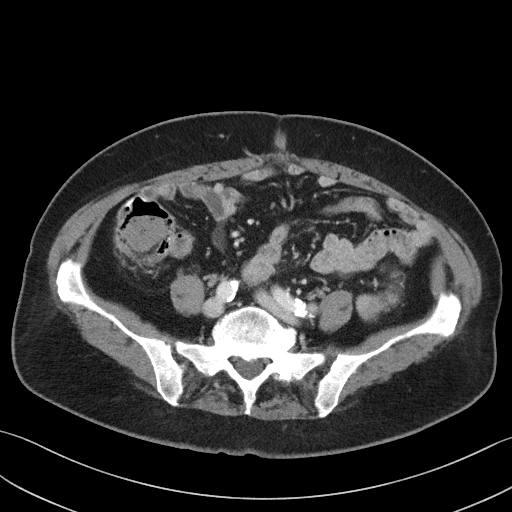
[im 51/89  soft-tissue]
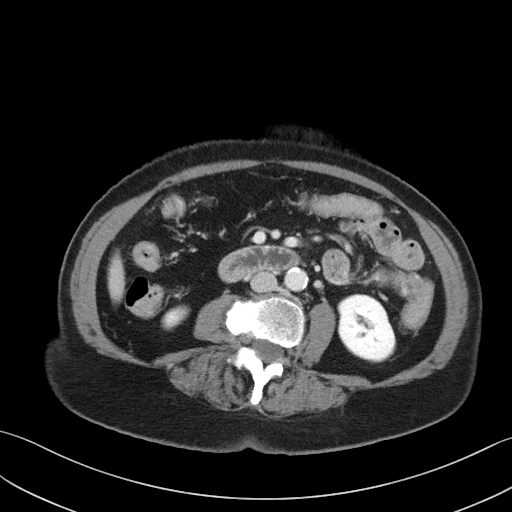

[12 of 46 positions shown; findings below may reference images not displayed]

FINDINGS: Lower chest: 2 mm subpleural nodule in the left lower lobe on image
[DATE]. Subsegmental atelectasis in the lingula. Mild scarring or
subsegmental atelectasis in the right middle lobe on image [DATE].

Right coronary artery atherosclerotic vascular disease.

Hepatobiliary: Cholecystectomy.  Otherwise unremarkable.

Pancreas: Unremarkable

Spleen: Unremarkable

Adrenals/Urinary Tract: Adrenal glands normal.

No urinary tract calculi, abnormal renal parenchymal enhancement, or
abnormal urographic phase filling defect along the urothelium to
explain the patient's hematuria. Nondistended urinary bladder.

Stomach/Bowel: Several diverticula in the vicinity of the splenic
flexure without inflammation. Orally administered contrast extends
through to the rectum.

Vascular/Lymphatic: Aortoiliac atherosclerotic vascular disease.

Reproductive: Hysterectomy.  No adnexal abnormality.

Other: No supplemental non-categorized findings.

Musculoskeletal: Levoconvex lumbar scoliosis with spondylosis and
degenerative disc disease, suspected right-sided impingement at L3-4
and L4-5.
IMPRESSION: 1. A cause for the patient's recurrent urinary tract infections is
not identified. No cause for hematuria is identified.
2. Aortoiliac and right coronary artery atherosclerosis.
3. Lumbar scoliosis with spondylosis and degenerative disc disease
with potential right-sided impingement at the L3-4 and L4-5 levels.

## 2017-12-16 ENCOUNTER — Encounter: Payer: Self-pay | Admitting: Neurology

## 2017-12-16 ENCOUNTER — Ambulatory Visit: Payer: Medicare Other | Admitting: Neurology

## 2017-12-16 VITALS — BP 164/78 | HR 62 | Ht 63.0 in | Wt 147.0 lb

## 2017-12-16 DIAGNOSIS — R413 Other amnesia: Secondary | ICD-10-CM | POA: Diagnosis not present

## 2017-12-16 DIAGNOSIS — R269 Unspecified abnormalities of gait and mobility: Secondary | ICD-10-CM | POA: Diagnosis not present

## 2017-12-16 NOTE — Progress Notes (Addendum)
Reason for visit: Memory disturbance, gait disturbance  Referring physician: Dr. Cori Razor is a 72 y.o. female  History of present illness:  Kirsten Martinez is a 72 year old right-handed white female with a history of a memory disorder that began about 1 year ago.  The patient has had some problems with short-term memory, she denies any issues with word finding or remembering names for people however.  She has had some problems with keeping up with her medications and keeping up with appointments, her husband has had to take over this over the last 2 months.  The patient has had difficulty with driving, she cannot remember how to start the car or turn on the lights when driving at night.  She has not been driving over the last 6 months.  The patient has a very strong family history of memory problems, her mother had dementia very late in life but she has 2 sisters, one younger and one older, who have Alzheimer's disease, one sister has passed away with this disease.  The patient has also had some issues with gait instability that began about 6 or 7 months ago.  The patient may get ahead of herself with walking, at times when she stands she may lean to the left and backwards.  She has had one fall.  The patient at times may have difficulty getting 1 of her legs to move when she tries to get up out of a chair and start to walk.  She does report some decreased energy and fatigue problems, but she believes that she is sleeping fairly well at night.  She does have episodic diarrhea and she reports some occasional problems with urinary urgency.  She denies any significant neck pain or low back pain or pain down the arms or legs.  The patient is sent to this office for an evaluation.  Past Medical History:  Diagnosis Date  . Coronary atherosclerosis of native coronary artery    Nonobstructive except 80% diagonal  . Essential hypertension, benign   . Helicobacter pylori ab+    Negative EGD  . Mixed hyperlipidemia   . Recurrent sinusitis    Suspected underlying allergic rhinitis    Past Surgical History:  Procedure Laterality Date  . BREAST BIOPSY  2001   Left  . LAPAROSCOPIC CHOLECYSTECTOMY  1999  . NASAL SINUS SURGERY    . TOTAL ABDOMINAL HYSTERECTOMY W/ BILATERAL SALPINGOOPHORECTOMY  2002    Family History  Problem Relation Age of Onset  . Heart attack Brother        Died at age 54    Social history:  reports that she quit smoking about 45 years ago. Her smoking use included cigarettes. She started smoking about 59 years ago. She has a 2.40 pack-year smoking history. She has never used smokeless tobacco. She reports that she does not drink alcohol or use drugs.  Medications:  Prior to Admission medications   Medication Sig Start Date End Date Taking? Authorizing Provider  aspirin 81 MG tablet Take 81 mg by mouth daily.     Yes [provider]  Calcium Carbonate-Vitamin D (CALTRATE 600+D) 600-400 MG-UNIT per tablet Take 1 tablet by mouth daily.     Yes [provider]  cetirizine (ZYRTEC) 10 MG tablet Take 10 mg by mouth as needed for allergies.   Yes [provider]  labetalol (NORMODYNE) 200 MG tablet TAKE 1 TABLET TWICE A DAY 01/04/12  Yes Jonelle Sidle, MD  Multiple  Vitamin (MULTIVITAMIN) tablet Take 1 tablet by mouth daily.     Yes [provider]  nitroGLYCERIN (NITROSTAT) 0.4 MG SL tablet Place 1 tablet (0.4 mg total) under the tongue every 5 (five) minutes as needed. 05/10/13  Yes Jonelle Sidle, MD  omeprazole (PRILOSEC OTC) 20 MG tablet Take 20 mg by mouth daily as needed.    Yes [provider]  rosuvastatin (CRESTOR) 20 MG tablet Take 20 mg by mouth daily.   Yes [provider]  traZODone (DESYREL) 50 MG tablet Take 100 mg by mouth at bedtime as needed.    Yes [provider]  triamcinolone (NASACORT) 55 MCG/ACT AERO nasal inhaler Place 2 sprays into the nose as needed.    Yes [provider]      Allergies  Allergen Reactions  . Azithromycin Other (See Comments)    Usually doesn't help  . Codeine Nausea Only    ROS:  Out of a complete 14 system review of symptoms, the patient complains only of the following symptoms, and all other reviewed systems are negative.  Fatigue Itching Snoring Diarrhea, constipation Joint pain Allergies Memory loss, headache, tremor Too much sleep, decreased energy Insomnia, restless legs  Blood pressure (!) 164/78, pulse 62, height 5\' 3"  (1.6 m), weight 147 lb (66.7 kg).  Physical Exam  General: The patient is alert and cooperative at the time of the examination.  The patient is moderately obese.  Eyes: Pupils are equal, round, and reactive to light. Discs are flat bilaterally.  Neck: The neck is supple, no carotid bruits are noted.  Respiratory: The respiratory examination is clear.  Cardiovascular: The cardiovascular examination reveals a regular rate and rhythm, no obvious murmurs or rubs are noted.  Skin: Extremities are without significant edema.  Neurologic Exam  Mental status: The patient is alert and oriented x 3 at the time of the examination. The Mini-Mental status examination done today shows a total score 20/30.  Cranial nerves: Facial symmetry is present. There is good sensation of the face to pinprick and soft touch bilaterally. The strength of the facial muscles and the muscles to head turning and shoulder shrug are normal bilaterally. Speech is well enunciated, no aphasia or dysarthria is noted. Extraocular movements are full. Visual fields are full. The tongue is midline, and the patient has symmetric elevation of the soft palate. No obvious hearing deficits are noted.  Motor: The motor testing reveals 5 over 5 strength of all 4 extremities. Good symmetric motor tone is noted throughout.  Sensory: Sensory testing is intact to pinprick, soft touch, vibration sensation, and position  sense on all 4 extremities. No evidence of extinction is noted.  Coordination: Cerebellar testing reveals good finger-nose-finger and heel-to-shin bilaterally.  No resting tremors are noted.  Gait and station: The patient is able to rise from seated position with arms crossed.  Once up, she can walk independently, there does appear to be some decreased arm swing on the right as compared to the left.  Tandem gait is normal. Romberg is negative. No drift is seen.  Reflexes: Deep tendon reflexes are symmetric and normal bilaterally. Toes are downgoing bilaterally.   Assessment/Plan:  1.  Progressive memory disturbance  2.  Mild gait disturbance  The patient has a very strong family history of Alzheimer's disease, the patient likely is developing a similar issue.  MRI of the brain will be done.  The patient has had blood work that included a vitamin B12 level that was normal.  The patient has a mild gait disturbance, she does have decreased arm swing on the right, she may get overbalanced at times with walking.  She could be developing early signs of Parkinson's disease, but she does not have enough on clinical examination to confirm this diagnosis.  The patient was sent for physical therapy for gait training.  We discussed the possibility of starting a medication such as Namenda for memory, the patient does not want to go on a medication currently.  She will follow-up in 6 months.  We may consider speech therapy evaluation for cognitive training in the future.  Marlan Palau. Keith Willis MD 12/16/2017 10:18 AM  [facility] [address] [place], [place] [id number]  Phone [phone] Fax [phone]

## 2017-12-19 ENCOUNTER — Telehealth: Payer: Self-pay | Admitting: Neurology

## 2017-12-19 NOTE — Telephone Encounter (Signed)
[employer] order sent to [facility]. No auth they will reach out to the pt to schedule.  °

## 2017-12-19 NOTE — Telephone Encounter (Signed)
lvm for pt to be aware. I also left [facility] phone number of [phone] and to give them a call if she has not heard in the next 2-3 business days.  °

## 2018-01-04 ENCOUNTER — Ambulatory Visit (HOSPITAL_COMMUNITY): Payer: Medicare Other

## 2018-01-05 ENCOUNTER — Encounter (HOSPITAL_COMMUNITY): Payer: Self-pay

## 2018-01-05 ENCOUNTER — Other Ambulatory Visit: Payer: Self-pay

## 2018-01-05 ENCOUNTER — Ambulatory Visit (HOSPITAL_COMMUNITY): Payer: Medicare Other | Attending: Neurology

## 2018-01-05 DIAGNOSIS — M6281 Muscle weakness (generalized): Secondary | ICD-10-CM | POA: Insufficient documentation

## 2018-01-05 DIAGNOSIS — R2689 Other abnormalities of gait and mobility: Secondary | ICD-10-CM | POA: Diagnosis not present

## 2018-01-05 NOTE — Therapy (Signed)
[facility] [address] [place], [place], [address] Phone: [phone]   Fax:  [phone]  Physical Therapy Evaluation  Patient Details  Name: Kirsten CommanderLinda B Martinez MRN: [id number] Date of Birth: 08/25/1945 Referring Provider (Kirsten Martinez): Kirsten SpanielWillis, Charles K, MD   Encounter Date: 01/05/2018  Kirsten Martinez End of Session - 01/05/18 1626    Visit Number  1    Number of Visits  13    Date for Kirsten Martinez Re-Evaluation  02/16/18   mini-reassess on 12/26   Authorization Type  [employer] Medicare (no auth required, no visit limit - based on medical neccessity)    Authorization Time Period  01/05/18 - 12/18/17    Authorization - Visit Number  0    Authorization - Number of Visits  10    Kirsten Martinez Start Time  1430    Kirsten Martinez Stop Time  1517    Kirsten Martinez Time Calculation (min)  47 min    Equipment Utilized During Treatment  Gait belt    Activity Tolerance  Patient tolerated treatment well    Behavior During Therapy  WFL for tasks assessed/performed       Past Medical History:  Diagnosis Date  . Coronary atherosclerosis of native coronary artery    Nonobstructive except 80% diagonal  . Essential hypertension, benign   . Helicobacter pylori ab+    Negative EGD  . Mixed hyperlipidemia   . Recurrent sinusitis    Suspected underlying allergic rhinitis    Past Surgical History:  Procedure Laterality Date  . BREAST BIOPSY  2001   Left  . LAPAROSCOPIC CHOLECYSTECTOMY  1999  . NASAL SINUS SURGERY    . TOTAL ABDOMINAL HYSTERECTOMY W/ BILATERAL SALPINGOOPHORECTOMY  2002    There were no vitals filed for this visit.   Subjective Assessment - 01/05/18 1439    Subjective  Patient arrives with her husband Kirsten Moneyony. They both provide subjective report as patient has history of short-term memory impairments. She reports her first fall was in April when they were preparing for a trip to [place]. They both state they started walking for exercise to be ready for the trip and had been walking ~ 1 mile  several days each week. She fell and scraped her face but id not sustain major injuries or go to the hospital. She also reports she has fallen a couple of time while piddling in her garden. She has stopped walking for exercise since her trip to [place] as her and her husband are worried she may fall again.     Patient is accompained by:  Family member    Pertinent History  short term memory difficulties    Limitations  Walking;House hold activities    Patient Stated Goals  to improve balance and return to walking    Currently in Pain?  No/denies         [facility] Assessment - 01/05/18 0001      Assessment   Medical Diagnosis  Gait and Balance Disturbance    Referring Provider (Kirsten Martinez)  Kirsten SpanielWillis, Charles K, MD    Onset Date/Surgical Date  05/26/17    Next MD Visit  6 months from now    Prior Therapy  none      Precautions   Precautions  None      Restrictions   Weight Bearing Restrictions  No      Balance Screen   Has the patient fallen in the past 6 months  Yes    How many times?  2    Has the patient had a decrease in activity level because of a fear of falling?   Yes    Is the patient reluctant to leave their home because of a fear of falling?   No      Home Public house manager residence    Living Arrangements  Spouse/significant other    Available Help at Discharge  Family    Type of Home  House    Home Access  Stairs to enter    Entrance Stairs-Number of Steps  3   1 in back   Entrance Stairs-Rails  None   no rails in back   Home Layout  Two level    Alternate Level Stairs-Number of Steps  14    Alternate Level Stairs-Rails  Right    Home Equipment  None    Additional Comments  Kirsten Martinez goes upstairs to clean and prepare guest rooms, also for storage in the attic      Prior Function   Level of Independence  Independent    Vocation  Retired    Gaffer  Patient is a retired Runner, 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    Leisure  Enjoys  reading, piddling in garden, traveling to see family      Cognition   Overall Cognitive Status  Within Functional Limits for tasks assessed    Memory  Impaired    Memory Impairment  Decreased short term memory   baseline     Observation/Other Assessments   Focus on Therapeutic Outcomes (FOTO)   take next session      Coordination   Gross Motor Movements are Fluid and Coordinated  Yes    Finger Nose Finger Test  Normal bilaterally    Heel Shin Test  Normal bilaterally      ROM / Strength   AROM / PROM / Strength  Strength      Strength   Strength Assessment Site  Hip;Knee;Ankle    Right Hip Flexion  4+/5    Right Hip Extension  4/5    Right Hip ABduction  4-/5    Left Hip Flexion  4+/5    Left Hip Extension  4/5    Left Hip ABduction  4-/5    Right/Left Knee  Right;Left    Right Knee Flexion  5/5    Right Knee Extension  5/5    Left Knee Flexion  5/5    Left Knee Extension  5/5    Right Ankle Dorsiflexion  5/5    Left Ankle Dorsiflexion  5/5      Transfers   Five time sit to stand comments   13.6    Comments  patient did not use UE's however relied on momentum and LE's against chair       Ambulation/Gait   Ambulation/Gait  Yes    Ambulation/Gait Assistance  7: Independent    Ambulation Distance (Feet)  520 Feet    Assistive device  None    Gait Pattern  Step-through pattern;Decreased arm swing - left;Decreased step length - left;Decreased stride length;Decreased hip/knee flexion - right;Decreased hip/knee flexion - left;Lateral trunk lean to left;Trendelenburg   Lt shoulder sits lower than Rt   Ambulation Surface  Level;Indoor    Gait velocity  1.3 m/s    Stairs  Yes    Stairs Assistance  7: Independent    Stair Management Technique  Alternating pattern;No rails;Forwards    Number of Stairs  12    Height of Stairs  6    Gait Comments  Unsteady but able to perform without rails. Patient unsteady when turning around requiring external support to prevent LOB       Balance   Balance Assessed  Yes      Static Standing Balance   Static Standing - Balance Support  No upper extremity supported    Static Standing - Level of Assistance  7: Independent    Static Standing Balance -  Activities   Single Leg Stance - Right Leg;Single Leg Stance - Left Leg;Tandam Stance - Right Leg;Tandam Stance - Left Leg;Romberg - Eyes Closed;Romberg - Eyes Opened    Static Standing - Comment/# of Minutes  SLS Rt = 10" SLS Lt = 8"; Rhomberg EO = 30" Rhomberg EC = 30"; Tandem Rt = 30" Tandem Lt = 30"      Dynamic Standing Balance   Dynamic Standing - Balance Support  No upper extremity supported    Dynamic Standing - Level of Assistance  7: Independent    Dynamic Standing - Balance Activities  Reaching for weighted objects    Dynamic Standing - Comments  Kirsten Martinez able to pick up 2lb weight from floor      Functional Gait  Assessment   Gait assessed   Yes    Gait Level Surface  Walks 20 ft in less than 5.5 sec, no assistive devices, good speed, no evidence for imbalance, normal gait pattern, deviates no more than 6 in outside of the 12 in walkway width.    Change in Gait Speed  Able to smoothly change walking speed without loss of balance or gait deviation. Deviate no more than 6 in outside of the 12 in walkway width.    Gait with Horizontal Head Turns  Performs head turns smoothly with slight change in gait velocity (eg, minor disruption to smooth gait path), deviates 6-10 in outside 12 in walkway width, or uses an assistive device.    Gait with Vertical Head Turns  Performs task with slight change in gait velocity (eg, minor disruption to smooth gait path), deviates 6 - 10 in outside 12 in walkway width or uses assistive device    Gait and Pivot Turn  Pivot turns safely within 3 sec and stops quickly with no loss of balance.    Step Over Obstacle  Is able to step over one shoe box (4.5 in total height) without changing gait speed. No evidence of imbalance.    Gait with Narrow Base of  Support  Ambulates 4-7 steps.    Gait with Eyes Closed  Walks 20 ft, uses assistive device, slower speed, mild gait deviations, deviates 6-10 in outside 12 in walkway width. Ambulates 20 ft in less than 9 sec but greater than 7 sec.    Ambulating Backwards  Walks 20 ft, uses assistive device, slower speed, mild gait deviations, deviates 6-10 in outside 12 in walkway width.    Steps  Alternating feet, no rail.    Total Score  23    FGA comment:  23/30 = indicates patient is at moderate fall risk, she was able to perform stairs without railings resulitng in a score of  "3" however was unsteady        Objective measurements completed on examination: See above findings.    [facility] Adult Kirsten Martinez Treatment/Exercise - 01/05/18 0001      Exercises   Exercises  Knee/Hip      Knee/Hip Exercises: Standing  SLS  1x 10 sec Bil LE      Knee/Hip Exercises: Sidelying   Clams  1x 10 reps Bil LE        Kirsten Martinez Education - 01/05/18 1630    Education Details  Educated on exam findings and appropriate POC. Educated on initial HEP.    Person(s) Educated  Patient;Spouse    Methods  Explanation;Demonstration;Handout    Comprehension  Verbalized understanding;Returned demonstration       Kirsten Martinez Short Term Goals - 01/05/18 1633      Kirsten Martinez SHORT TERM GOAL #1   Title  Patient will be independent with HEP, updated PRN, to improve balance and LE strength to be able to return to walking for regular exercise and decrease fall risk.    Time  2    Period  Weeks    Status  New    Target Date  01/19/18      Kirsten Martinez SHORT TERM GOAL #2   Title  Patient will perform 5x sit to stand in 12 seconds or less with good control, no use of LE's, UE's, and no use of momentum to perform transfers indicating improve functional LE strength.    Time  3    Period  Weeks    Status  New    Target Date  01/26/18        Kirsten Martinez Long Term Goals - 01/05/18 1634      Kirsten Martinez LONG TERM GOAL #1   Title  Patient will improve FGA score by 4 points to  indicate significant decresae in fall risk and to move from moderat risk category to low risk to improve safety while mobilizing in community.     Time  6    Period  Weeks    Status  New    Target Date  02/16/18      Kirsten Martinez LONG TERM GOAL #2   Title  Patient will improve SLS to 20 seconds or greater on bil LE's demonstrating improved balance to improve safety with gait and stair mobility.     Time  6    Period  Weeks    Status  New      Kirsten Martinez LONG TERM GOAL #3   Title  Patient will report walkign 2-3x/week, reporting no fear of falling during activity, for regular exercise outside or in gym to improve ovearll health and wellness.    Time  6    Period  Weeks    Status  New        Plan - 01/05/18 1628    Clinical Impression Statement  Kirsten Martinez presents for physical therapy evaluation for gait and balance disturbances. She has experienced several falls in her garden in the last 6 months and last April she fell while walking outside. Prior to her fall in April she was walking several times/week for regular exercise. She presents with bil hip weakness, impaired balance in SLS, and impaired balance during functional/dynamic gait activities. Her FGA score is a 23/30 indicating she is at moderate risk for falling. She is unsteady with Lt turns more than Rt, and has a slight trunk lean towards her Lt. She has normal testing with coordination test including rapid alternating movements test. Kirsten Martinez will benefit from skilled physical therapy interventions to address impairments and improve balance to reduce fall risk and return to regular walking/exercise.     History and Personal Factors relevant to plan of care:  Kirsten Martinez has history of short term memory impairment and will  require cues throughout    Clinical Presentation  Stable    Clinical Presentation due to:  giat impairments, impaired balance, weakness of Bil LE, clinical judgement    Clinical Decision Making  Low    Rehab Potential  Good     Kirsten Martinez Frequency  2x / week    Kirsten Martinez Duration  6 weeks    Kirsten Martinez Treatment/Interventions  ADLs/Self Care Home Management;Aquatic Therapy;Stair training;Gait training;Functional mobility training;Therapeutic activities;Therapeutic exercise;Balance training;Neuromuscular re-education;Patient/family education;Passive range of motion;Manual techniques    Kirsten Martinez Next Visit Plan  Review Eval and goals. Initiate balance training with dynamic surfaces and dual task as well as obstacles during gait. Perform hip strengthening and progress HEP wtih band for clamshell.    Kirsten Martinez Home Exercise Plan  Eval: clamshell, SLS at counter    Consulted and Agree with Plan of Care  Patient;Family member/caregiver    Family Member Consulted  Kirsten Martinez's husband, Kirsten Martinez       Patient will benefit from skilled therapeutic intervention in order to improve the following deficits and impairments:  Abnormal gait, Postural dysfunction, Decreased mobility, Decreased activity tolerance, Decreased endurance, Decreased strength, Difficulty walking, Decreased balance  Visit Diagnosis: Other abnormalities of gait and mobility  Muscle weakness (generalized)     Problem List Patient Active Problem List   Diagnosis Date Noted  . Coronary atherosclerosis of native coronary artery 05/26/2010  . Essential hypertension, benign 04/22/2010  . Mixed hyperlipidemia 04/22/2010    Kirsten Martinez, Kirsten Martinez, Kirsten Martinez Physical Therapist with [facility]  01/05/2018 4:41 PM    [facility] [address] [place], [place], [address] Phone: [phone]   Fax:  [phone]  Name: Kirsten Martinez MRN: [id number] Date of Birth: March 02, 1945

## 2018-01-05 NOTE — Patient Instructions (Signed)
Clamshell reps: 10 sets: 1 hold: 3 seconds daily: 2  weekly: 7      Exercise image step 1   Exercise image step 2  Setup  Begin lying on your side with your legs bent and feet together. Movement  Lift your top knee upward while keeping your feet together, then lower it back down and repeat. Tip  Make sure that your hips do not fall backward as you lift your leg. Focus your awareness on the movement of your leg. Standing Single Leg Stance with Counter Support reps: 10 sets: 1 hold: 5-10 seconds daily: 2  weekly: 7      Exercise image step 1   Exercise image step 2   Exercise image step 3  Setup  Begin in a standing upright position with your hands resting on a counter. Movement  Lift one foot off the ground and maintain your balance in this position. Tip  Make sure to maintain an upright posture and use the counter to help you balance as needed.

## 2018-01-09 ENCOUNTER — Ambulatory Visit (HOSPITAL_COMMUNITY): Payer: Medicare Other

## 2018-01-09 DIAGNOSIS — M6281 Muscle weakness (generalized): Secondary | ICD-10-CM

## 2018-01-09 DIAGNOSIS — R2689 Other abnormalities of gait and mobility: Secondary | ICD-10-CM

## 2018-01-09 NOTE — Patient Instructions (Signed)
HIP: Abduction / External Rotation (Band)    Place band around knees. Lie on side with hips and knees bent. Raise top knee up, squeezing glutes. Keep feet together. Hold 3-5___ seconds. Use _red___ band. _10-15__ reps per set, _1__ sets per day on each side.  Copyright  VHI. All rights reserved.   Balance: Eyes Open - Unilateral (Varied Surfaces)    Stand on left foot, eyes open. Maintain balance _30___ seconds. Repeat _2___ times per set. Do _1___ sets per session.  Repeat on compliant surface: foam.  Copyright  VHI. All rights reserved.   Tandem Stance    Right foot in front of left, heel touching toe both feet "straight ahead". Stand on Foot Triangle of Support with both feet. Balance in this position 30___ seconds. Do with left foot in front of right.  Copyright  VHI. All rights reserved.

## 2018-01-09 NOTE — Therapy (Addendum)
[facility] [address] [place], [place], [address] Phone: [phone]   Fax:  [phone]  Physical Therapy Treatment  Patient Details  Name: Kirsten Martinez MRN: [id number] Date of Birth: 10/29/1945 Referring Provider (PT): York Spaniel, MD   Encounter Date: 01/09/2018  PT End of Session - 01/09/18 1211    Visit Number  2    Number of Visits  13    Date for PT Re-Evaluation  02/16/18   mini-reassess on 12/26   Authorization Type  [employer] Medicare (no auth required, no visit limit - based on medical neccessity)    Authorization Time Period  01/05/18 - 12/18/17    Authorization - Visit Number  1    Authorization - Number of Visits  10    PT Start Time  1115    PT Stop Time  1204    PT Time Calculation (min)  49 min    Equipment Utilized During Treatment  Gait belt    Activity Tolerance  Patient tolerated treatment well    Behavior During Therapy  WFL for tasks assessed/performed       Past Medical History:  Diagnosis Date  . Coronary atherosclerosis of native coronary artery    Nonobstructive except 80% diagonal  . Essential hypertension, benign   . Helicobacter pylori ab+    Negative EGD  . Mixed hyperlipidemia   . Recurrent sinusitis    Suspected underlying allergic rhinitis    Past Surgical History:  Procedure Laterality Date  . BREAST BIOPSY  2001   Left  . LAPAROSCOPIC CHOLECYSTECTOMY  1999  . NASAL SINUS SURGERY    . TOTAL ABDOMINAL HYSTERECTOMY W/ BILATERAL SALPINGOOPHORECTOMY  2002    There were no vitals filed for this visit.  Subjective Assessment - 01/09/18 1207    Subjective  No new complaints; husband present during session. Husband and patient would like to begin a walking program and are interested in ideas on how to do this safely. Will be going to [facility] in January 2020.    Patient is accompained by:  Family member    Pertinent History  short term memory difficulties    Limitations   Walking;House hold activities    Patient Stated Goals  to improve balance and return to walking         [facility] PT Assessment - 01/09/18 0001      Observation/Other Assessments   Focus on Therapeutic Outcomes (FOTO)   36% limited                   [facility] Adult PT Treatment/Exercise - 01/09/18 0001      Ambulation/Gait   Ambulation/Gait  Yes    Ambulation/Gait Assistance  6: Modified independent (Device/Increase time)    Ambulation Distance (Feet)  226 Feet    Assistive device  Other (Comment)   hiking stick   Gait Pattern  Step-through pattern    Ambulation Surface  Level;Indoor    Gait Comments  verbal cues for sequencing of steps          Balance Exercises - 01/09/18 1207      Balance Exercises: Standing   Tandem Stance  Eyes open;Foam/compliant surface;Intermittent upper extremity support;2 reps;30 secs    SLS  Eyes closed;Solid surface;Foam/compliant surface;Intermittent upper extremity support;2 reps;30 secs        PT Education - 01/09/18 1211    Education Details  Reviewed goals, eval and HEP. Advanced HEP.     Person(s) Educated  Patient;Spouse    Methods  Explanation;Demonstration;Handout    Comprehension  Verbalized understanding;Returned demonstration       PT Short Term Goals - 01/05/18 1633      PT SHORT TERM GOAL #1   Title  Patient will be independent with HEP, updated PRN, to improve balance and LE strength to be able to return to walking for regular exercise and decrease fall risk.    Time  2    Period  Weeks    Status  New    Target Date  01/19/18      PT SHORT TERM GOAL #2   Title  Patient will perform 5x sit to stand in 12 seconds or less with good control, no use of LE's, UE's, and no use of momentum to perform transfers indicating improve functional LE strength.    Time  3    Period  Weeks    Status  New    Target Date  01/26/18        PT Long Term Goals - 01/05/18 1634      PT LONG TERM GOAL #1   Title  Patient will  improve FGA score by 4 points to indicate significant decresae in fall risk and to move from moderat risk category to low risk to improve safety while mobilizing in community.     Time  6    Period  Weeks    Status  New    Target Date  02/16/18      PT LONG TERM GOAL #2   Title  Patient will improve SLS to 20 seconds or greater on bil LE's demonstrating improved balance to improve safety with gait and stair mobility.     Time  6    Period  Weeks    Status  New      PT LONG TERM GOAL #3   Title  Patient will report walkign 2-3x/week, reporting no fear of falling during activity, for regular exercise outside or in gym to improve ovearll health and wellness.    Time  6    Period  Weeks    Status  New            Plan - 01/09/18 1214    Clinical Impression Statement  Continued with established plan of care. Reviewed goals, eval and HEP. Performed FOTO. Tandem on foam and SLS on solid surface and foam performed and added to HEP. Gave patient red theraband to add to her clams at home. Gait training with hiking stick and spoke about possible use of gait belt during walking program with husband in [place]. Continue with current plan, progress as able.     Rehab Potential  Good    PT Frequency  2x / week    PT Duration  6 weeks    PT Treatment/Interventions  ADLs/Self Care Home Management;Aquatic Therapy;Stair training;Gait training;Functional mobility training;Therapeutic activities;Therapeutic exercise;Balance training;Neuromuscular re-education;Patient/family education;Passive range of motion;Manual techniques    PT Next Visit Plan  Review Eval and goals. Initiate balance training with dynamic surfaces and dual task as well as obstacles during gait. Perform hip strengthening and progress HEP wtih band for clamshell.    PT Home Exercise Plan  Eval: clamshell, SLS at counter; 01/09/18 - tandem and SLS on foam, clams with RTB    Consulted and Agree  with Plan of Care  Patient;Family member/caregiver    Family Member Consulted  pt's husband, Alinda Moneyony  Patient will benefit from skilled therapeutic intervention in order to improve the following deficits and impairments:  Abnormal gait, Postural dysfunction, Decreased mobility, Decreased activity tolerance, Decreased endurance, Decreased strength, Difficulty walking, Decreased balance  Visit Diagnosis: Other abnormalities of gait and mobility  Muscle weakness (generalized)     Problem List Patient Active Problem List   Diagnosis Date Noted  . Coronary atherosclerosis of native coronary artery 05/26/2010  . Essential hypertension, benign 04/22/2010  . Mixed hyperlipidemia 04/22/2010    Katina Dung. Hartnett-Rands, MS, PT Per [facility] #[id number] 01/09/2018, 12:33 PM  [facility] [address] [place], [place], [address] Phone: [phone]   Fax:  [phone]  Name: Kirsten Martinez MRN: [id number] Date of Birth: 10-08-1945

## 2018-01-11 ENCOUNTER — Encounter (HOSPITAL_COMMUNITY): Payer: Medicare Other

## 2018-01-16 ENCOUNTER — Ambulatory Visit (HOSPITAL_COMMUNITY): Payer: Medicare Other | Admitting: Physical Therapy

## 2018-01-16 ENCOUNTER — Telehealth (HOSPITAL_COMMUNITY): Payer: Self-pay | Admitting: Family Medicine

## 2018-01-16 NOTE — Telephone Encounter (Signed)
01/16/18  husband called to [facility] said they were out of town and won't be back until tomorrow

## 2018-01-18 ENCOUNTER — Ambulatory Visit (HOSPITAL_COMMUNITY): Payer: Medicare Other

## 2018-01-18 ENCOUNTER — Encounter (HOSPITAL_COMMUNITY): Payer: Self-pay

## 2018-01-18 DIAGNOSIS — M6281 Muscle weakness (generalized): Secondary | ICD-10-CM

## 2018-01-18 DIAGNOSIS — R2689 Other abnormalities of gait and mobility: Secondary | ICD-10-CM

## 2018-01-18 NOTE — Therapy (Signed)
[facility] [address] [place], [place], [address] Phone: [phone]   Fax:  [phone]  Physical Therapy Treatment  Patient Details  Name: Kirsten Martinez MRN: [id number] Date of Birth: 1945-09-07 Referring Provider (PT): York Spaniel, MD   Encounter Date: 01/18/2018  PT End of Session - 01/18/18 1600    Visit Number  3    Number of Visits  13    Date for PT Re-Evaluation  02/16/18   Minireassess 01/26/2018   Authorization Type  [employer] Medicare (no auth required, no visit limit - based on medical neccessity)    Authorization Time Period  01/05/18 - 12/18/17    Authorization - Visit Number  2    Authorization - Number of Visits  10    PT Start Time  1520    PT Stop Time  1558    PT Time Calculation (min)  38 min    Equipment Utilized During Treatment  Gait belt    Activity Tolerance  Patient tolerated treatment well    Behavior During Therapy  WFL for tasks assessed/performed       Past Medical History:  Diagnosis Date  . Coronary atherosclerosis of native coronary artery    Nonobstructive except 80% diagonal  . Essential hypertension, benign   . Helicobacter pylori ab+    Negative EGD  . Mixed hyperlipidemia   . Recurrent sinusitis    Suspected underlying allergic rhinitis    Past Surgical History:  Procedure Laterality Date  . BREAST BIOPSY  2001   Left  . LAPAROSCOPIC CHOLECYSTECTOMY  1999  . NASAL SINUS SURGERY    . TOTAL ABDOMINAL HYSTERECTOMY W/ BILATERAL SALPINGOOPHORECTOMY  2002    There were no vitals filed for this visit.  Subjective Assessment - 01/18/18 1520    Subjective  No c/o.  Pt arrived with husband and reports has completed some of her HEP.  Stated SLS with eye shut is difficult.  Discussed walking program and going to [facility] in January 2020.                            Balance Exercises - 01/18/18 1537      Balance Exercises: Standing   Tandem Stance  4 reps;Eyes  open;Foam/compliant surface;Intermittent upper extremity support   4 sets; static on foam; head movements; UE flexion; eyes shu   SLS  Eyes closed;Solid surface;Foam/compliant surface;Intermittent upper extremity support;2 reps;30 secs;Eyes open    SLS with Vectors  Solid surface;3 reps   3x 5" intermittent HA   Balance Beam  tandem and sidestep on balance beam 2RT    Tandem Gait  Forward;1 rep;Foam/compliant surface;2 reps    Step Over Hurdles / Cones  weave around cone and over hurdles; lateral step over 6 then 12in hurdles          PT Short Term Goals - 01/05/18 1633      PT SHORT TERM GOAL #1   Title  Patient will be independent with HEP, updated PRN, to improve balance and LE strength to be able to return to walking for regular exercise and decrease fall risk.    Time  2    Period  Weeks    Status  New    Target Date  01/19/18      PT SHORT TERM GOAL #2   Title  Patient will perform 5x sit to stand in 12 seconds or less with good control,  no use of LE's, UE's, and no use of momentum to perform transfers indicating improve functional LE strength.    Time  3    Period  Weeks    Status  New    Target Date  01/26/18        PT Long Term Goals - 01/05/18 1634      PT LONG TERM GOAL #1   Title  Patient will improve FGA score by 4 points to indicate significant decresae in fall risk and to move from moderat risk category to low risk to improve safety while mobilizing in community.     Time  6    Period  Weeks    Status  New    Target Date  02/16/18      PT LONG TERM GOAL #2   Title  Patient will improve SLS to 20 seconds or greater on bil LE's demonstrating improved balance to improve safety with gait and stair mobility.     Time  6    Period  Weeks    Status  New      PT LONG TERM GOAL #3   Title  Patient will report walkign 2-3x/week, reporting no fear of falling during activity, for regular exercise outside or in gym to improve ovearll health and wellness.    Time  6     Period  Weeks    Status  New            Plan - 01/18/18 1808    Clinical Impression Statement  Session focus on dynamic balance activities.  Progressed balance activities with dynamic surface as well as head/UE movements and dynamic gait activities.  Pt educated on assistance with visual imput wiht SLS and increased difficulty removing with eyes shut as well posture and encouraged to look up rather than at floor.  No reports of pain through session.  Husband sat through session.      Rehab Potential  Good    PT Frequency  2x / week    PT Duration  6 weeks    PT Treatment/Interventions  ADLs/Self Care Home Management;Aquatic Therapy;Stair training;Gait training;Functional mobility training;Therapeutic activities;Therapeutic exercise;Balance training;Neuromuscular re-education;Patient/family education;Passive range of motion;Manual techniques    PT Next Visit Plan  Add sidestep next session with theraband.  Continue balance training with dynamic surfaces and dual task as well as obstacles during gait. Perform hip strengthening.    PT Home Exercise Plan  Eval: clamshell, SLS at counter; 01/09/18 - tandem and SLS on foam, clams with RTB       Patient will benefit from skilled therapeutic intervention in order to improve the following deficits and impairments:  Abnormal gait, Postural dysfunction, Decreased mobility, Decreased activity tolerance, Decreased endurance, Decreased strength, Difficulty walking, Decreased balance  Visit Diagnosis: Other abnormalities of gait and mobility  Muscle weakness (generalized)     Problem List Patient Active Problem List   Diagnosis Date Noted  . Coronary atherosclerosis of native coronary artery 05/26/2010  . Essential hypertension, benign 04/22/2010  . Mixed hyperlipidemia 04/22/2010   Becky Saxasey Cockerham, LPTA; CBIS [phone]  Juel BurrowCockerham, Casey Jo 01/18/2018, 6:14 PM  [facility] [address]  [place], [place], [address] Phone: [phone]   Fax:  [phone]  Name: Kirsten CommanderLinda B Martinez MRN: [id number] Date of Birth: [birth year]

## 2018-01-27 ENCOUNTER — Encounter (HOSPITAL_COMMUNITY): Payer: Self-pay

## 2018-01-27 ENCOUNTER — Ambulatory Visit (HOSPITAL_COMMUNITY): Payer: Medicare Other

## 2018-01-27 DIAGNOSIS — R2689 Other abnormalities of gait and mobility: Secondary | ICD-10-CM | POA: Diagnosis not present

## 2018-01-27 DIAGNOSIS — M6281 Muscle weakness (generalized): Secondary | ICD-10-CM

## 2018-01-27 NOTE — Patient Instructions (Signed)
Band Walk: Side Stepping    Tie band around legs, just above knees. Step 20 feet to one side, then step back to start. Note: Small towel between band and skin eases rubbing.  http://plyo.exer.us/76   Copyright  VHI. All rights reserved.

## 2018-01-27 NOTE — Therapy (Signed)
[facility] [address] [place], [place], [address] Phone: [phone]   Fax:  [phone]  Physical Therapy Treatment  Patient Details  Name: Kirsten Martinez MRN: [id number] Date of Birth: [birth year] Referring Provider (PT): York Spaniel, MD   Encounter Date: 01/27/2018  PT End of Session - 01/27/18 1305    Visit Number  4    Number of Visits  13    Date for PT Re-Evaluation  02/16/18   Minireassess 01/26/2018   Authorization Type  [employer]HC Medicare (no auth required, no visit limit - based on medical neccessity)    Authorization Time Period  01/05/18 - 02/17/18    Authorization - Visit Number  3    Authorization - Number of Visits  10    PT Start Time  1300    PT Stop Time  1345   EOS on Nustep x 5', not included wiht charges   PT Time Calculation (min)  45 min    Equipment Utilized During Treatment  Gait belt    Activity Tolerance  Patient tolerated treatment well    Behavior During Therapy  WFL for tasks assessed/performed       Past Medical History:  Diagnosis Date  . Coronary atherosclerosis of native coronary artery    Nonobstructive except 80% diagonal  . Essential hypertension, benign   . Helicobacter pylori ab+    Negative EGD  . Mixed hyperlipidemia   . Recurrent sinusitis    Suspected underlying allergic rhinitis    Past Surgical History:  Procedure Laterality Date  . BREAST BIOPSY  2001   Left  . LAPAROSCOPIC CHOLECYSTECTOMY  1999  . NASAL SINUS SURGERY    . TOTAL ABDOMINAL HYSTERECTOMY W/ BILATERAL SALPINGOOPHORECTOMY  2002    There were no vitals filed for this visit.  Subjective Assessment - 01/27/18 1303    Subjective  Pt arrived with husband and stated she is feeling good today.  Reports she has decresed compliance wiht HEP and walking program due to the holidays.      Patient Stated Goals  to improve balance and return to walking    Currently in Pain?  No/denies                        [facility] Adult PT Treatment/Exercise - 01/27/18 0001      Knee/Hip Exercises: Aerobic   Nustep  5' L2 with UE/LE at EOS      Knee/Hip Exercises: Standing   Heel Raises  10 reps    Heel Raises Limitations  squat then heel raise with ball in hand and 18in step     Functional Squat  2 sets;10 reps    Functional Squat Limitations  cueing for mechanics      Knee/Hip Exercises: Sidelying   Clams  RTB 10reps x 5" holds BLE          Balance Exercises - 01/27/18 1307      Balance Exercises: Standing   Tandem Stance  Eyes open;Foam/compliant surface;3 reps   tandem stance wiht head turns then UE flexion; rotation foam   SLS  Eyes closed;Solid surface;Foam/compliant surface;Intermittent upper extremity support;2 reps;30 secs;Eyes open   Lt 18", Rt 25" max of 3   SLS with Vectors  Solid surface;3 reps   5" holds with intermittent HHA   Balance Beam  tandem and sidestep on balance beam 2RT    Sidestepping  2 reps;Theraband    Other Standing Exercises  DGI activiites x 500 feet          PT Short Term Goals - 01/05/18 1633      PT SHORT TERM GOAL #1   Title  Patient will be independent with HEP, updated PRN, to improve balance and LE strength to be able to return to walking for regular exercise and decrease fall risk.    Time  2    Period  Weeks    Status  New    Target Date  01/19/18      PT SHORT TERM GOAL #2   Title  Patient will perform 5x sit to stand in 12 seconds or less with good control, no use of LE's, UE's, and no use of momentum to perform transfers indicating improve functional LE strength.    Time  3    Period  Weeks    Status  New    Target Date  01/26/18        PT Long Term Goals - 01/05/18 1634      PT LONG TERM GOAL #1   Title  Patient will improve FGA score by 4 points to indicate significant decresae in fall risk and to move from moderat risk category to low risk to improve safety while mobilizing in community.      Time  6    Period  Weeks    Status  New    Target Date  02/16/18      PT LONG TERM GOAL #2   Title  Patient will improve SLS to 20 seconds or greater on bil LE's demonstrating improved balance to improve safety with gait and stair mobility.     Time  6    Period  Weeks    Status  New      PT LONG TERM GOAL #3   Title  Patient will report walkign 2-3x/week, reporting no fear of falling during activity, for regular exercise outside or in gym to improve ovearll health and wellness.    Time  6    Period  Weeks    Status  New            Plan - 01/27/18 1503    Clinical Impression Statement  Session focus with balance training and hip strengthening.  Added squats and sidestep with resistance for gluteal strengthening and progressed balance activities with dynamic surface and dynamic movements included tandem stance with head/UE movements and DGI activities.  Some cueing required to improve UE motion with gait.  EOS on Nustep to improve UE/LE sequence/coordination as well as activity tolerance as pt reports decreased activity tolerance with walking activities at home.      Rehab Potential  Good    PT Frequency  2x / week    PT Duration  6 weeks    PT Treatment/Interventions  ADLs/Self Care Home Management;Aquatic Therapy;Stair training;Gait training;Functional mobility training;Therapeutic activities;Therapeutic exercise;Balance training;Neuromuscular re-education;Patient/family education;Passive range of motion;Manual techniques    PT Next Visit Plan  Reassess next session, [place]  Continue balance training wiht dynamic surfaces and dual task as well as obstacles during gait.  Hip strengthening.    PT Home Exercise Plan  Eval: clamshell, SLS at counter; 01/09/18 - tandem and SLS on foam, clams with RTB; 01/27/2018: side step with RTB       Patient will benefit from skilled therapeutic intervention in order to improve the following deficits and  impairments:  Abnormal gait, Postural dysfunction,  Decreased mobility, Decreased activity tolerance, Decreased endurance, Decreased strength, Difficulty walking, Decreased balance  Visit Diagnosis: Other abnormalities of gait and mobility  Muscle weakness (generalized)     Problem List Patient Active Problem List   Diagnosis Date Noted  . Coronary atherosclerosis of native coronary artery 05/26/2010  . Essential hypertension, benign 04/22/2010  . Mixed hyperlipidemia 04/22/2010   Becky Saxasey Cockerham, LPTA; CBIS [phone]  Juel BurrowCockerham, Casey Jo 01/27/2018, 3:10 PM  [facility] [address] [place], [place], [address] Phone: [phone]   Fax:  [phone]  Name: Kirsten CommanderLinda B Martinez MRN: [id number] Date of Birth: 1945/08/14

## 2018-02-06 ENCOUNTER — Encounter (HOSPITAL_COMMUNITY): Payer: Medicare Other

## 2018-02-08 ENCOUNTER — Encounter (HOSPITAL_COMMUNITY): Payer: Medicare Other

## 2018-02-10 ENCOUNTER — Telehealth (HOSPITAL_COMMUNITY): Payer: Self-pay | Admitting: Family Medicine

## 2018-02-10 NOTE — Telephone Encounter (Signed)
02/10/18  husband left a message to [facility] stating they were out of town and would call back to reschedule

## 2018-02-13 ENCOUNTER — Ambulatory Visit (HOSPITAL_COMMUNITY): Payer: Medicare Other

## 2018-02-15 ENCOUNTER — Encounter (HOSPITAL_COMMUNITY): Payer: Medicare Other

## 2018-02-21 ENCOUNTER — Encounter (HOSPITAL_COMMUNITY): Payer: Medicare Other

## 2018-02-23 ENCOUNTER — Encounter (HOSPITAL_COMMUNITY): Payer: Medicare Other

## 2018-02-28 ENCOUNTER — Encounter (HOSPITAL_COMMUNITY): Payer: Medicare Other

## 2018-03-02 ENCOUNTER — Encounter (HOSPITAL_COMMUNITY): Payer: Medicare Other

## 2018-04-17 ENCOUNTER — Encounter (HOSPITAL_COMMUNITY): Payer: Self-pay

## 2018-04-17 NOTE — Therapy (Signed)
[place], [place], [address] Phone: [phone]   Fax:  [phone]  Patient Details  Name: Kirsten Martinez MRN: [id number] Date of Birth: May 27, 1945 Referring Provider:  No ref. provider found  Encounter Date: 04/17/2018   PHYSICAL THERAPY DISCHARGE SUMMARY  Visits from Start of Care: 4  Current functional level related to goals / functional outcomes: Patient is being discharged from current episode of therapy as she cancelled her appointments and planned to reschedule but did not do so. She is being discharged for not returning since last treatment session and will need to obtain a new referral if she wishes to return to therapy.   Remaining deficits: See last treatment note and below goals, not formally  Re-assessed due to lack of duration of therapy.   PT Short Term Goals - 01/05/18 1633            PT SHORT TERM GOAL #1   Title  Patient will be independent with HEP, updated PRN, to improve balance and LE strength to be able to return to walking for regular exercise and decrease fall risk.    Time  2    Period  Weeks    Status     Target Date  01/19/18        PT SHORT TERM GOAL #2   Title  Patient will perform 5x sit to stand in 12 seconds or less with good control, no use of LE's, UE's, and no use of momentum to perform transfers indicating improve functional LE strength.    Time  3    Period  Weeks    Status     Target Date  01/26/18           PT Long Term Goals - 01/05/18 1634            PT LONG TERM GOAL #1   Title  Patient will improve FGA score by 4 points to indicate significant decresae in fall risk and to move from moderat risk category to low risk to improve safety while mobilizing in community.     Time  6    Period  Weeks    Status     Target Date  02/16/18        PT LONG TERM GOAL #2   Title  Patient will improve SLS to 20 seconds or greater on bil  LE's demonstrating improved balance to improve safety with gait and stair mobility.     Time  6    Period  Weeks    Status         PT LONG TERM GOAL #3   Title  Patient will report walkign 2-3x/week, reporting no fear of falling during activity, for regular exercise outside or in gym to improve ovearll health and wellness.    Time  6    Period  Weeks    Status          Education / Equipment: Educated patient to obtain a new referral if she wishes to return for therapy at any time.    Plan: Patient agrees to discharge.  Patient goals were not met. Patient is being discharged due to not returning since the last visit.  ?????     Kipp Brood, PT, DPT, [facility] Physical Therapist with [facility]  04/17/2018 10:21 AM    [place], [place], [address] Phone: [phone]  Fax:  [phone] 

## 2018-06-28 ENCOUNTER — Telehealth: Payer: Self-pay | Admitting: Neurology

## 2018-06-28 NOTE — Telephone Encounter (Signed)
Due to current COVID 19 pandemic, our office is severely reducing in office visits until further notice, in order to minimize the risk to our patients and healthcare providers.  °  °Called patient and confirmed a virtual visit for her 6/2 appointment. Patient verbalized understanding of the [email or web address] process and I have sent her an e-mail with link and directions as well as my name and office number/hours for reference. Patient understands that she will receive a call from RN to update chart. °  °Pt understands that although there may be some limitations with this type of visit, we will take all precautions to reduce any security or privacy concerns.  Pt understands that this will be treated like an in office visit and we will file with pt's insurance, and there may be a patient responsible charge related to this service. °  °

## 2018-07-03 NOTE — Telephone Encounter (Signed)
I reached out to the pt and was able to update EMR for 07/04/18 virtual visit.

## 2018-07-04 ENCOUNTER — Other Ambulatory Visit: Payer: Self-pay

## 2018-07-04 ENCOUNTER — Ambulatory Visit: Payer: Medicare Other | Admitting: Neurology

## 2018-07-04 ENCOUNTER — Encounter: Payer: Self-pay | Admitting: Neurology

## 2018-07-04 NOTE — Telephone Encounter (Signed)
Patient's husband called this morning at 10:35 stating that they had gotten appointments mixed up and are currently at the urologist. I rescheduled patient for Thursday 6/4 at 2:30 and advised to use the same link/email I had previously sent. Pt verbalized understanding.

## 2018-07-04 NOTE — Telephone Encounter (Signed)
Noted. Thanks.

## 2018-07-04 NOTE — Progress Notes (Signed)
The patient was unable to join the meeting, this will be rescheduled.

## 2018-07-06 ENCOUNTER — Other Ambulatory Visit: Payer: Self-pay

## 2018-07-06 ENCOUNTER — Encounter: Payer: Self-pay | Admitting: Neurology

## 2018-07-06 ENCOUNTER — Ambulatory Visit (INDEPENDENT_AMBULATORY_CARE_PROVIDER_SITE_OTHER): Payer: Medicare Other | Admitting: Neurology

## 2018-07-06 ENCOUNTER — Telehealth: Payer: Self-pay | Admitting: Neurology

## 2018-07-06 DIAGNOSIS — R413 Other amnesia: Secondary | ICD-10-CM | POA: Diagnosis not present

## 2018-07-06 HISTORY — DX: Other amnesia: R41.3

## 2018-07-06 NOTE — Telephone Encounter (Signed)
[employer] medicare order sent to GI. No auth they will reach out to the pt to schedule.  °

## 2018-07-06 NOTE — Progress Notes (Signed)
      Virtual Visit via Video Note  I connected with Kirsten Martinez on 07/06/18 at  2:30 PM EDT by a video enabled telemedicine application and verified that I am speaking with the correct person using two identifiers.  Location: Patient: The patient is at home. Provider: Physician in office.   I discussed the limitations of evaluation and management by telemedicine and the availability of in person appointments. The patient expressed understanding and agreed to proceed.  History of Present Illness: Kirsten Martinez is a 72 year old right-handed white female with a history of progressive memory disturbance consistent with Alzheimer's disease.  The patient has had significant short-term memory issues over the last year.  She was seen about 6 months ago, and was set up for MRI of the brain but this was never done.  The patient did not wish to go on any medications for memory at that time.  The patient does not operate a motor vehicle, she requires assistance keeping up with medications and appointments, her husband does the finances.  The patient is still cooking some.  She will misplace things about the house on occasion.  She has not given up any activities of daily living because of memory since last seen.  She has had some troubles with walking and balance, but this has improved with physical therapy, the patient has not had any recent falls.  She did have some increased confusion about 3 weeks ago but was found to have a urinary tract infection.   Observations/Objective: The video evaluation reveals the patient is alert and cooperative.  Speech is well enunciated, not aphasic or dysarthric.  Extraocular movements are full.  The patient is able to protrude the tongue in the midline with good lateral movement of the tongue.  Face is symmetric.  She has good finger-nose-finger and heel shin bilaterally.  Gait is normal.  Tandem gait is minimally unsteady.  Romberg is negative.  No drift is  seen.  The [facility]-blind evaluation reveals a score of 11/22.  Assessment and Plan: 1.  Progressive memory disturbance, Alzheimer's disease  2.  Gait disorder, improved with physical therapy  We once again discussed the possibility of going on medication for memory.  The patient will "think about it".  We will get her set up for MRI of the brain.  She will follow-up in 6 months.  They will let me know if they wish to go on a medication for memory.  Follow Up Instructions: 64-month follow-up, may see nurse practitioner.   I discussed the assessment and treatment plan with the patient. The patient was provided an opportunity to ask questions and all were answered. The patient agreed with the plan and demonstrated an understanding of the instructions.   The patient was advised to call back or seek an in-person evaluation if the symptoms worsen or if the condition fails to improve as anticipated.  I provided 25 minutes of non-face-to-face time during this encounter.   York Spaniel, MD

## 2018-07-19 ENCOUNTER — Ambulatory Visit
Admission: RE | Admit: 2018-07-19 | Discharge: 2018-07-19 | Disposition: A | Discharge status: Home / Self Care | Payer: Medicare Other | Source: Ambulatory Visit | Attending: Neurology | Admitting: Neurology

## 2018-07-19 DIAGNOSIS — R413 Other amnesia: Secondary | ICD-10-CM

## 2018-07-20 ENCOUNTER — Telehealth: Payer: Self-pay | Admitting: Neurology

## 2018-07-20 NOTE — Telephone Encounter (Signed)
Called the patient.  MRI of the brain done reveals mild to moderate white matter disease, some mild cortical atrophy.  No evidence of acute changes.  If the patient desires go on medication for memory, she will contact our office.  MRI brain 07/20/18:  IMPRESSION: Slightly abnormal MRI scan of the brain showing age-appropriate changes of chronic microvascular ischemia and generalized cerebral atrophy.  There are incidental changes of chronic paranasal sinusitis.

## 2018-12-04 ENCOUNTER — Telehealth: Payer: Self-pay | Admitting: Neurology

## 2018-12-04 NOTE — Telephone Encounter (Signed)
Pts husband called in and wants to know if the can be referred to a psychiatrist , she is having dreams to where she thinks they are real.

## 2018-12-04 NOTE — Telephone Encounter (Signed)
I reached out to the pt's husband and  lvm (ok per drp. I advised I had received his message. I stated Dr. Jannifer Franklin is currently out of the office and will return 12/11/2018. I further stated once Dr. Jannifer Franklin returned I would have him review and make referral if agreeable. I advised of office hours and that we would be closed on 12/05/2018.

## 2019-01-08 NOTE — Progress Notes (Signed)
PATIENT: Kirsten CommanderLinda B Martinez DOB: [birth year]  REASON FOR VISIT: follow up HISTORY FROM: patient  HISTORY OF PRESENT ILLNESS: Today 01/09/19 Kirsten Martinez is a 73 year old female with history of progressive memory disturbance consistent with Alzheimer's disease.  MRI of the brain revealed mild to moderate white matter disease, some mild cortical atrophy.  Importantly, she reports her short-term memory has declined.  She continues to live with her husband.  She does not operate a car.  They mostly eat out, but she still enjoys baking.  She reads the newspaper 2 days a week. She had 1 fall a few months ago, at a soccer tournament.  She is responsible for paying a few bills, such as lawn maintenance.  Her husband manages her medications.  She takes trazodone at bedtime.  In general she sleeps well.  She has had some vivid dreams.  There have been a few times where she has had some hallucinations.  They have been resistant to starting memory medications.  She is able to do her own ADLs.  She presents today for evaluation accompanied by her husband.  HISTORY 07/06/2018 Dr. Anne HahnWillis: Kirsten Martinez is a 73 year old right-handed white female with a history of progressive memory disturbance consistent with Alzheimer's disease.  The patient has had significant short-term memory issues over the last year.  She was seen about 6 months ago, and was set up for MRI of the brain but this was never done.  The patient did not wish to go on any medications for memory at that time.  The patient does not operate a motor vehicle, she requires assistance keeping up with medications and appointments, her husband does the finances.  The patient is still cooking some.  She will misplace things about the house on occasion.  She has not given up any activities of daily living because of memory since last seen.  She has had some troubles with walking and balance, but this has improved with physical therapy, the patient has not  had any recent falls.  She did have some increased confusion about 3 weeks ago but was found to have a urinary tract infection.   REVIEW OF SYSTEMS: Out of a complete 14 system review of symptoms, the patient complains only of the following symptoms, and all other reviewed systems are negative.  Memory loss  ALLERGIES: Allergies  Allergen Reactions   Azithromycin Other (See Comments)    Usually doesn't help   Codeine Nausea Only    HOME MEDICATIONS: Outpatient Medications Prior to Visit  Medication Sig Dispense Refill   aspirin 81 MG tablet Take 81 mg by mouth daily.       Calcium Carbonate-Vitamin D (CALTRATE 600+D) 600-400 MG-UNIT per tablet Take 1 tablet by mouth 2 (two) times daily. Morning and night     cetirizine (ZYRTEC) 10 MG tablet Take 10 mg by mouth as needed for allergies.     labetalol (NORMODYNE) 200 MG tablet TAKE 1 TABLET TWICE A DAY 60 tablet 1   Multiple Vitamin (MULTIVITAMIN) tablet Take 1 tablet by mouth daily.       nitroGLYCERIN (NITROSTAT) 0.4 MG SL tablet Place 1 tablet (0.4 mg total) under the tongue every 5 (five) minutes as needed. 25 tablet 3   omeprazole (PRILOSEC OTC) 20 MG tablet Take 20 mg by mouth daily as needed.      traZODone (DESYREL) 50 MG tablet Take 100 mg by mouth at bedtime as needed.      triamcinolone (NASACORT) 55 MCG/ACT AERO  nasal inhaler Place 2 sprays into the nose as needed.     rosuvastatin (CRESTOR) 20 MG tablet Take 20 mg by mouth daily.     No facility-administered medications prior to visit.     PAST MEDICAL HISTORY: Past Medical History:  Diagnosis Date   Coronary atherosclerosis of native coronary artery    Nonobstructive except 80% diagonal   Essential hypertension, benign    Helicobacter pylori ab+    Negative EGD   Memory difficulty 07/06/2018   Mixed hyperlipidemia    Recurrent sinusitis    Suspected underlying allergic rhinitis    PAST SURGICAL HISTORY: Past Surgical History:  Procedure  Laterality Date   BREAST BIOPSY  2001   Left   LAPAROSCOPIC CHOLECYSTECTOMY  1999   NASAL SINUS SURGERY     TOTAL ABDOMINAL HYSTERECTOMY W/ BILATERAL SALPINGOOPHORECTOMY  2002    FAMILY HISTORY: Family History  Problem Relation Age of Onset   Heart attack Brother        Died at age 94   Dementia Mother    Alcohol abuse Father    Dementia Sister    Dementia Sister     SOCIAL HISTORY: Social History   Socioeconomic History   Marital status: Married    Spouse name: Not on file   Number of children: Not on file   Years of education: Not on file   Highest education level: Not on file  Occupational History   Occupation: Retired  Ecologist strain: Not on file   Food insecurity    Worry: Not on file    Inability: Not on Occupational hygienist needs    Medical: Not on file    Non-medical: Not on file  Tobacco Use   Smoking status: Former Smoker    Packs/day: 0.40    Years: 6.00    Pack years: 2.40    Types: Cigarettes    Start date: 02/01/1958    Quit date: 11/01/1972    Years since quitting: 46.2   Smokeless tobacco: Never Used   Tobacco comment: quit smoking age 54,had smoked 1 pack/week for several years  Substance and Sexual Activity   Alcohol use: No    Alcohol/week: 0.0 standard drinks   Drug use: No   Sexual activity: Not on file  Lifestyle   Physical activity    Days per week: Not on file    Minutes per session: Not on file   Stress: Not on file  Relationships   Social connections    Talks on phone: Not on file    Gets together: Not on file    Attends religious service: Not on file    Active member of club or organization: Not on file    Attends meetings of clubs or organizations: Not on file    Relationship status: Not on file   Intimate partner violence    Fear of current or ex partner: Not on file    Emotionally abused: Not on file    Physically abused: Not on file    Forced sexual activity: Not on  file  Other Topics Concern   Not on file  Social History Narrative   Children -1 daughter,1 son   Taught math in middle school, retired Jan 2011   Exercise - yes, walking daily at present   PHYSICAL EXAM  Vitals:   01/09/19 1409  BP: 138/72  Pulse: 66  Temp: (!) 97.5 F (36.4 C)  Weight: 145 lb 3.2 oz (  65.9 kg)  Height: 5\' 3"  (1.6 m)   Body mass index is 25.72 kg/m.  Generalized: Well developed, in no acute distress  MMSE - Mini Mental State Exam 01/09/2019 12/16/2017  Orientation to time 3 3  Orientation to Place 2 3  Registration 0 3  Attention/ Calculation 0 2  Recall 0 0  Language- name 2 objects 2 2  Language- repeat 1 1  Language- follow 3 step command 3 3  Language- read & follow direction 1 1  Write a sentence 1 1  Copy design 1 1  Copy design-comments named 7 animals -  Total score 14 20    Neurological examination  Mentation: Alert oriented to time, place, history taking. Follows all commands speech and language fluent Cranial nerve II-XII: Pupils were equal round reactive to light. Extraocular movements were full, visual field were full on confrontational test. Facial sensation and strength were normal. Head turning and shoulder shrug  were normal and symmetric. Motor: The motor testing reveals 5 over 5 strength of all 4 extremities. Good symmetric motor tone is noted throughout.  Sensory: Sensory testing is intact to soft touch on all 4 extremities. No evidence of extinction is noted.  Coordination: Cerebellar testing reveals good finger-nose-finger and heel-to-shin bilaterally.  Gait and station: Gait is normal. Tandem gait is mildly unsteady. Reflexes: Deep tendon reflexes are symmetric and normal bilaterally.   DIAGNOSTIC DATA (LABS, IMAGING, TESTING) - I reviewed patient records, labs, notes, testing and imaging myself where available.  No results found for: WBC, HGB, HCT, MCV, PLT    Component Value Date/Time   CREATININE 0.60 03/16/2016 1701    No results found for: CHOL, HDL, LDLCALC, LDLDIRECT, TRIG, CHOLHDL No results found for: HGBA1C No results found for: VITAMINB12 No results found for: TSH   ASSESSMENT AND PLAN 73 y.o. year old female  has a past medical history of Coronary atherosclerosis of native coronary artery, Essential hypertension, benign, Helicobacter pylori ab+, Memory difficulty (07/06/2018), Mixed hyperlipidemia, and Recurrent sinusitis. here with:  1. Memory loss  I will start Namenda 5 mg daily.  She has been resistant to starting memory medications in the past.  I did not start Aricept due to report of vivid dreams, and some hallucinations.  If she tolerates the Namenda after 1 month I will increase the dose to 10 mg twice a day.  She will follow-up in 6 months or sooner if needed.  I did advise if her symptoms worsen or she develops any new symptoms she should let us know.  I spent 15 minutes with the patient. 50% of this time was spent discussing her plan of care.   Butler Denmark, AGNP-C, DNP 01/09/2019, 2:20 PM [facility] [address], [place], [place] [address] [phone]

## 2019-01-09 ENCOUNTER — Ambulatory Visit: Payer: Medicare Other | Admitting: Neurology

## 2019-01-09 ENCOUNTER — Encounter: Payer: Self-pay | Admitting: Neurology

## 2019-01-09 ENCOUNTER — Other Ambulatory Visit: Payer: Self-pay

## 2019-01-09 VITALS — BP 138/72 | HR 66 | Temp 97.5°F | Ht 63.0 in | Wt 145.2 lb

## 2019-01-09 DIAGNOSIS — R413 Other amnesia: Secondary | ICD-10-CM | POA: Diagnosis not present

## 2019-01-09 MED ORDER — MEMANTINE HCL 5 MG PO TABS: 5.0000 mg | ORAL_TABLET | Freq: Every day | ORAL | 1 refills | Status: DC

## 2019-01-09 NOTE — Patient Instructions (Signed)
Start Namenda 5 mg daily, after 1 month let me know if you are tolerating the dose, and we can increase the medication to 10 mg twice a day.   Follow-up in 6 months or sooner if needed

## 2019-01-09 NOTE — Progress Notes (Signed)
I have read the note, and I agree with the clinical assessment and plan.  Jatasia Gundrum K Hector Taft   

## 2019-01-31 ENCOUNTER — Other Ambulatory Visit: Payer: Self-pay | Admitting: Neurology

## 2019-03-03 ENCOUNTER — Other Ambulatory Visit: Payer: Self-pay | Admitting: Neurology

## 2019-03-05 ENCOUNTER — Telehealth: Payer: Self-pay

## 2019-03-05 NOTE — Telephone Encounter (Signed)
Pt has up coming appointment. Pt was last seen 01/09/19.  No Aricept because of Vivid dreams, per NP SS.

## 2019-07-11 ENCOUNTER — Ambulatory Visit: Payer: Medicare Other | Admitting: Neurology

## 2023-11-02 DEATH — deceased
